# Patient Record
Sex: Female | Born: 1985 | Race: White | Hispanic: No | Marital: Single | Smoking: Never smoker
Health system: Southern US, Community
[De-identification: ages and names within clinical notes are randomized; demographics above are authoritative.]

## PROBLEM LIST (undated history)

## (undated) DIAGNOSIS — K5792 Diverticulitis of intestine, part unspecified, without perforation or abscess without bleeding: Secondary | ICD-10-CM

## (undated) DIAGNOSIS — I10 Essential (primary) hypertension: Secondary | ICD-10-CM

## (undated) HISTORY — DX: Essential (primary) hypertension: I10

## (undated) HISTORY — DX: Diverticulitis of intestine, part unspecified, without perforation or abscess without bleeding: K57.92

---
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
## 2018-11-30 ENCOUNTER — Encounter: Payer: Self-pay | Admitting: Gastroenterology

## 2019-02-04 ENCOUNTER — Other Ambulatory Visit: Payer: Self-pay

## 2019-02-04 ENCOUNTER — Telehealth: Payer: Self-pay

## 2019-02-04 ENCOUNTER — Encounter: Payer: Self-pay | Admitting: Gastroenterology

## 2019-02-04 ENCOUNTER — Encounter: Payer: Self-pay | Admitting: Nurse Practitioner

## 2019-02-04 ENCOUNTER — Ambulatory Visit (INDEPENDENT_AMBULATORY_CARE_PROVIDER_SITE_OTHER): Payer: Medicaid Other | Admitting: Nurse Practitioner

## 2019-02-04 DIAGNOSIS — K5732 Diverticulitis of large intestine without perforation or abscess without bleeding: Secondary | ICD-10-CM | POA: Diagnosis not present

## 2019-02-04 MED ORDER — CLENPIQ 10-3.5-12 MG-GM -GM/160ML PO SOLN: 1.0000 | Freq: Once | ORAL | 0 refills | Status: AC

## 2019-02-04 NOTE — Assessment & Plan Note (Signed)
The patient has had 2 episodes of diverticulitis.  Most recently she had an episode in February 2020 which required 6 days of hospitalization and drain placement due to diverticulitis complicated by an abscess.  Drain has been pulled before discharge.  Follow-up with surgeon was unremarkable.  Plans to schedule a colonoscopy however this was canceled due to the COVID-19/coronavirus pandemic.  At this point she is generally asymptomatic from a GI standpoint.  Given her history of diverticulitis with no previous colonoscopy will plan for colonoscopy at this time.  Continue current medications.  I explained the likelihood that she would be called to have preop COVID-19 testing with subsequent quarantine after the test and before her procedure.  She verbalized understanding.  I also explained the possibility if she continues to have episodes of diverticulitis there may be justification for partial colectomy.  She also verbalized understanding.  Proceed with colonoscopy with Dr. Darrick Penna in the near future. The risks, benefits, and alternatives have been discussed in detail with the patient. They state understanding and desire to proceed.   The patient is not on any anticoagulants, anxiolytics, chronic pain medications, or antidepressants.  Conscious sedation should be adequate for her procedure.

## 2019-02-04 NOTE — Patient Instructions (Signed)
Your health issues we discussed today were:   Diverticulitis: 1. As we discussed we will plan to do a colonoscopy to evaluate your colon after having diverticulitis 2. As I informed, you will likely have to have a COVID-19/coronavirus test prior to your procedure.  They will likely require you to stay quarantined after your test until your procedure.  This is for your safety as well as the safety of the staff taking care of you 3. Call us if you have any severe or worsening symptoms 4. Further recommendations will follow your colonoscopy  Overall I recommend:  1. Continue your other medications 2. Return for follow-up in 4 months 3. Call us if you have any questions or concerns.   Because of recent events of COVID-19 ("Coronavirus"), follow CDC recommendations:  1. Wash your hand frequently 2. Avoid touching your face 3. Stay away from people who are sick 4. If you have symptoms such as fever, cough, shortness of breath then call your healthcare provider for further guidance 5. If you are sick, STAY AT HOME unless otherwise directed by your healthcare provider. 6. Follow directions from state and national officials regarding staying safe   At [facility] we value your feedback. You may receive a survey about your visit today. Please share your experience as we strive to create trusting relationships with our patients to provide genuine, compassionate, quality care.  We appreciate your understanding and patience as we review any laboratory studies, imaging, and other diagnostic tests that are ordered as we care for you. Our office policy is 5 business days for review of these results, and any emergent or urgent results are addressed in a timely manner for your best interest. If you do not hear from our office in 1 week, please contact us.   We also encourage the use of MyChart, which contains your medical information for your review as well. If you are not enrolled in this  feature, an access code is on this after visit summary for your convenience. Thank you for allowing [place] to be involved in your care.  It was great to see you today!  I hope you have a great day!!

## 2019-02-04 NOTE — Telephone Encounter (Signed)
Called pt, TCS w/[facility] scheduled for 04/09/19 at 12:15pm. Rx for prep sent to pharmacy. Orders entered. Instructions mailed. Pt informed she may need to have COVID-19 test prior to procedure and be quarantined after test until procedure.

## 2019-02-04 NOTE — Progress Notes (Addendum)
REVIEWED-NO ADDITIONAL RECOMMENDATIONS.Marland Kitchen  Referring Provider: Lianne Moris, PA-C Primary Care Physician:  Lianne Moris, PA-C Primary GI:  Dr. Darrick Penna  NOTE: Service was provided via telemedicine and was requested by the patient due to COVID-19 pandemic.  Method of visit: Doxy.Me  Patient Location: Home  Provider Location: Office  Reason for Phone Visit: Referral from PCP  The patient was consented to phone follow-up via telephone encounter including billing of the encounter (yes/no): Yes  Persons present on the phone encounter, with roles: Children  Total time (minutes) spent on medical discussion: 22 minutes  Chief Complaint  Patient presents with  . Diverticulitis    diagnosed 02/2018 originally, had abcess on bladder d/t diverticulitis in feb 2020.  Marland Kitchen Abdominal Pain    comes/goes, location: bottom abd, crampy pain    HPI:   Megan West is a 33 y.o. female who presents for virtual visit regarding: On referral from primary care for diverticulitis.  Reviewed information provided with referral including office visit dated 11/18/2018.  The patient was referred for recurrent diverticulitis most recently with abscess formation.  She was recommended to increase her fluids and fiber in her diet.  Care Everywhere chart reviewed. Patient was recently admitted to [facility] hospital for diverticulitis with abscess. Had planned colonoscopy for follow-up by surgeon at [facility] which was cancelled due to COVID-19.  Today she states she's doing ok overall. Most recent diverticulitis was early February when she had abdominal pain and when she couldn't take it anymore she went to the hospital and was admitted for 6 days. She had a drain placed which was removed before discharge. Saw the surgeon 2 weeks post-discharge. They have wanted to do a colonoscopy and was schedule 01/07/19 which was cancelled and was told they would call her with a future date, but hasn't heard since. She  states she's had diverticulitis twice: once in 2019 and again in February 2020. Has never had a colonoscopy before. Denies current abdominal pain, N/V, fever, chills, unintentional weight loss; has lost 10 lbs recently with a concerted effort for weight loss. Denies URI or flu-like symptoms. Denies loss of sense of taste/smell. Denies chest pain, dyspnea, dizziness, lightheadedness, syncope, near syncope. Denies any other upper or lower GI symptoms.  Past Medical History:  Diagnosis Date  . Diverticulitis    2019 & 10/2018 (with abscess)  . Hypertension     History reviewed. No pertinent surgical history.  Current Outpatient Medications  Medication Sig Dispense Refill  . metoprolol succinate (TOPROL-XL) 50 MG 24 hr tablet Take 50 mg by mouth daily.    . ondansetron (ZOFRAN) 4 MG tablet Take 4 mg by mouth every 8 (eight) hours as needed for nausea or vomiting.     No current facility-administered medications for this visit.     Allergies as of 02/04/2019  . (No Known Allergies)    Family History  Problem Relation Age of Onset  . Colon cancer Neg Hx     Social History   Socioeconomic History  . Marital status: Single    Spouse name: Not on file  . Number of children: Not on file  . Years of education: Not on file  . Highest education level: Not on file  Occupational History  . Not on file  Social Needs  . Financial resource strain: Not on file  . Food insecurity:    Worry: Not on file    Inability: Not on file  . Transportation needs:    Medical: Not on file  Non-medical: Not on file  Tobacco Use  . Smoking status: Never Smoker  . Smokeless tobacco: Never Used  Substance and Sexual Activity  . Alcohol use: Yes    Comment: occas wine but none since 10/2018  . Drug use: Never  . Sexual activity: Not on file  Lifestyle  . Physical activity:    Days per week: Not on file    Minutes per session: Not on file  . Stress: Not on file  Relationships  . Social  connections:    Talks on phone: Not on file    Gets together: Not on file    Attends religious service: Not on file    Active member of club or organization: Not on file    Attends meetings of clubs or organizations: Not on file    Relationship status: Not on file  Other Topics Concern  . Not on file  Social History Narrative  . Not on file    Review of Systems: Complete ROS negative except as per HPI.  Physical Exam: Note: limited exam due to virtual visit General:   Alert and oriented. Pleasant and cooperative. Well-nourished and well-developed.  Head:  Normocephalic and atraumatic. Eyes:  Without icterus, sclera clear and conjunctiva pink.  Ears:  Normal auditory acuity. Skin:  Intact without facial significant lesions or rashes. Neurologic:  Alert and oriented x4;  grossly normal neurologically. Psych:  Alert and cooperative. Normal mood and affect. Heme/Lymph/Immune: No excessive bruising noted.

## 2019-02-04 NOTE — Progress Notes (Signed)
CC'D TO PCP °

## 2019-02-16 ENCOUNTER — Telehealth: Payer: Self-pay | Admitting: Gastroenterology

## 2019-02-16 DIAGNOSIS — R109 Unspecified abdominal pain: Secondary | ICD-10-CM

## 2019-02-16 NOTE — Telephone Encounter (Signed)
Needs CT abd/pelvis with contrast asap. Pregnancy screen prior.

## 2019-02-16 NOTE — Telephone Encounter (Signed)
PT said she has been having some episodes of pain in her left side ( and sometimes her right side also). More in lower abdomen. The pains are sharp and last from a few seconds to a few minutes and then she is OK. They are happening more frequently and she is sure that it is a flare of diverticulitis. She has had no fever and no nausea or vomiting. Also, since her last flare when she had an abcess on her bladder, she started having some dizzy spells and this happens occasionally also. She has never passed out, but it is just worrisome. She is aware Wynne Dust, NP who she had visit with is out this week. I am sending message to Lewie Loron, NP to advise.

## 2019-02-16 NOTE — Telephone Encounter (Signed)
PA for CT a/p with contrast was approved via [employer]'s website. Auth# [id number] dates 02/16/2019-08/15/2019.  CT scheduled for 5/28 at 3pm, arrival time 2:45pm, npo 4 hrs prior, needs to pick up oral contrast from [facility] radiology no later than tomorrow, will need to go for lab work today/tomorrow as well.  Called patient, VM full.

## 2019-02-16 NOTE — Telephone Encounter (Signed)
Pt has questions about her diverticulitis and could we call something in at [facility]. [phone]

## 2019-02-16 NOTE — Telephone Encounter (Signed)
Patient is aware of appt details. She voiced understanding.

## 2019-02-16 NOTE — Addendum Note (Signed)
Addended by: Tommie Sams on: 02/16/2019 03:30 PM   Modules accepted: Orders

## 2019-02-16 NOTE — Telephone Encounter (Signed)
Pt is aware and forwarding to [facility] to schedule.  

## 2019-02-17 ENCOUNTER — Other Ambulatory Visit (HOSPITAL_COMMUNITY)
Admission: RE | Admit: 2019-02-17 | Discharge: 2019-02-17 | Disposition: A | Discharge status: Home / Self Care | Payer: Medicaid Other | Source: Ambulatory Visit | Attending: Gastroenterology | Admitting: Gastroenterology

## 2019-02-17 ENCOUNTER — Other Ambulatory Visit: Payer: Self-pay

## 2019-02-17 DIAGNOSIS — R109 Unspecified abdominal pain: Secondary | ICD-10-CM | POA: Insufficient documentation

## 2019-02-17 LAB — HCG, SERUM, QUALITATIVE: Preg, Serum: NEGATIVE

## 2019-02-18 ENCOUNTER — Other Ambulatory Visit: Payer: Self-pay

## 2019-02-18 ENCOUNTER — Ambulatory Visit (HOSPITAL_COMMUNITY)
Admission: RE | Admit: 2019-02-18 | Discharge: 2019-02-18 | Disposition: A | Discharge status: Home / Self Care | Payer: Medicaid Other | Source: Ambulatory Visit | Attending: Gastroenterology | Admitting: Gastroenterology

## 2019-02-18 ENCOUNTER — Other Ambulatory Visit: Payer: Self-pay | Admitting: Gastroenterology

## 2019-02-18 DIAGNOSIS — R109 Unspecified abdominal pain: Secondary | ICD-10-CM | POA: Insufficient documentation

## 2019-02-18 LAB — POCT I-STAT CREATININE: Creatinine, Ser: 0.6 mg/dL (ref 0.44–1.00)

## 2019-02-18 MED ORDER — IOHEXOL 300 MG/ML  SOLN
100.0000 mL | Freq: Once | INTRAMUSCULAR | Status: AC | PRN
  Administered 2019-02-18: 100 mL via INTRAVENOUS

## 2019-02-18 MED ORDER — METRONIDAZOLE 500 MG PO TABS: 500.0000 mg | ORAL_TABLET | Freq: Three times a day (TID) | ORAL | 0 refills | Status: AC

## 2019-02-18 MED ORDER — CIPROFLOXACIN HCL 500 MG PO TABS: 500.0000 mg | ORAL_TABLET | Freq: Two times a day (BID) | ORAL | 0 refills | Status: AC

## 2019-02-18 NOTE — Progress Notes (Signed)
Discussed with Dr. Allena Katz, radiologist. Definite abscess. No definite fistulous tract to bladder but will need to monitor clinically. I was able to reach patient. She is afebrile, mild, fleeting pain intermittently. No N/V. No changes in bowel habits. Discussed with Dr. Darrick Penna. Will treat for 2 weeks with antibiotics as outpatient and need to repeat CT in 3 weeks.   [facility] clinical pool: please arrange CT abd/pelvis in 3 weeks, follow-up diverticulitis with abscess. Also, we need to go ahead and refer to surgery for discussion of colectomy. Colonoscopy is scheduled for July: keep date for now but may change depending on clinical progression.  Patient aware.

## 2019-02-22 ENCOUNTER — Encounter: Payer: Self-pay | Admitting: *Deleted

## 2019-02-22 ENCOUNTER — Telehealth: Payer: Self-pay | Admitting: Nurse Practitioner

## 2019-02-22 ENCOUNTER — Other Ambulatory Visit: Payer: Self-pay | Admitting: *Deleted

## 2019-02-22 DIAGNOSIS — K5732 Diverticulitis of large intestine without perforation or abscess without bleeding: Secondary | ICD-10-CM

## 2019-02-22 DIAGNOSIS — K5792 Diverticulitis of intestine, part unspecified, without perforation or abscess without bleeding: Secondary | ICD-10-CM

## 2019-02-22 NOTE — Telephone Encounter (Signed)
Attempted to call patient and ask which surgeon she would like to see for recurrent diverticulitis (most recent with abscess). Received "call cannot be completed as dialed" despite multiple attempts in different ways. Will try again later.

## 2019-02-22 NOTE — Telephone Encounter (Signed)
Spoke with patient and she lives in [place] but prefers [facility]. Aware of recommendation for surgical consult to consider surgical treatment of recurrent diverticulitis (most recently with abscess requiring drain placement.)  Please refer to [facility] (Drs. Shon Baton and Lovell Sheehan)  Cc: [facility] for [facility]

## 2019-02-22 NOTE — Addendum Note (Signed)
Addended by: Tommie Sams on: 02/22/2019 01:35 PM   Modules accepted: Orders

## 2019-02-22 NOTE — Telephone Encounter (Signed)
REVIEWED-NO ADDITIONAL RECOMMENDATIONS. 

## 2019-02-22 NOTE — Telephone Encounter (Signed)
Referral placed.

## 2019-02-25 ENCOUNTER — Telehealth: Payer: Self-pay | Admitting: *Deleted

## 2019-02-25 NOTE — Telephone Encounter (Signed)
Per AB we need to postpone TCS, patient needs to see surgery first.  I attempted to call patient but VM and unable to leave VM. Also looks like the surgeon's office have been unable to reach patient as well.  Tobi Bastos, are we canceling for now?

## 2019-03-01 ENCOUNTER — Telehealth: Payer: Self-pay | Admitting: *Deleted

## 2019-03-01 NOTE — Telephone Encounter (Signed)
PA initiated vis [employer] website and it was approved for CT abd/pelvis with contrast. Authorization Number:[id number] dates 03/01/2019-08/28/2019.

## 2019-03-01 NOTE — Telephone Encounter (Signed)
Hold off on TCS right now. Needs surgical referral.

## 2019-03-01 NOTE — Telephone Encounter (Signed)
Called # for pt and received message the # has been changed, D/C or no longer in service. I have mailed patient letter. No alternate # in chart. FYI to AB we have not been able to each patient and surgery has also sent letter.

## 2019-03-09 NOTE — Telephone Encounter (Signed)
Noted. She will still need CT when recovers. Please have her contact us in 2 weeks. We may need to send letter as it has been difficult to touch base with her.

## 2019-03-09 NOTE — Telephone Encounter (Signed)
Patient CT was cancelled. Reason states patient positive for COVID-19. FYI to AB

## 2019-03-09 NOTE — Telephone Encounter (Signed)
Letter mailed

## 2019-03-16 ENCOUNTER — Ambulatory Visit: Payer: Self-pay | Admitting: General Surgery

## 2019-03-16 ENCOUNTER — Ambulatory Visit (HOSPITAL_COMMUNITY): Payer: Medicaid Other

## 2019-04-09 ENCOUNTER — Ambulatory Visit (HOSPITAL_COMMUNITY): Admit: 2019-04-09 | Payer: Medicaid Other | Admitting: Gastroenterology

## 2019-04-09 ENCOUNTER — Encounter (HOSPITAL_COMMUNITY): Payer: Self-pay

## 2019-04-09 SURGERY — COLONOSCOPY
Anesthesia: Moderate Sedation

## 2019-04-14 ENCOUNTER — Other Ambulatory Visit (HOSPITAL_COMMUNITY): Payer: Medicaid Other

## 2019-04-15 ENCOUNTER — Other Ambulatory Visit: Payer: Self-pay

## 2019-04-15 ENCOUNTER — Ambulatory Visit (HOSPITAL_COMMUNITY)
Admission: RE | Admit: 2019-04-15 | Discharge: 2019-04-15 | Disposition: A | Discharge status: Home / Self Care | Payer: Medicaid Other | Source: Ambulatory Visit | Attending: Gastroenterology | Admitting: Gastroenterology

## 2019-04-15 DIAGNOSIS — K5732 Diverticulitis of large intestine without perforation or abscess without bleeding: Secondary | ICD-10-CM | POA: Insufficient documentation

## 2019-04-15 LAB — POCT I-STAT CREATININE: Creatinine, Ser: 0.6 mg/dL (ref 0.44–1.00)

## 2019-04-15 MED ORDER — IOHEXOL 350 MG/ML SOLN: 100.0000 mL | Freq: Once | INTRAVENOUS | Status: DC | PRN

## 2019-04-15 MED ORDER — IOHEXOL 300 MG/ML  SOLN
100.0000 mL | Freq: Once | INTRAMUSCULAR | Status: AC | PRN
  Administered 2019-04-15: 100 mL via INTRAVENOUS

## 2019-04-20 ENCOUNTER — Ambulatory Visit (INDEPENDENT_AMBULATORY_CARE_PROVIDER_SITE_OTHER): Payer: Medicaid Other | Admitting: General Surgery

## 2019-04-20 ENCOUNTER — Other Ambulatory Visit: Payer: Self-pay

## 2019-04-20 ENCOUNTER — Encounter: Payer: Self-pay | Admitting: General Surgery

## 2019-04-20 VITALS — BP 132/89 | HR 92 | Temp 97.7°F | Resp 16 | Ht 60.0 in | Wt 164.0 lb

## 2019-04-20 DIAGNOSIS — K5732 Diverticulitis of large intestine without perforation or abscess without bleeding: Secondary | ICD-10-CM

## 2019-04-20 MED ORDER — METRONIDAZOLE 500 MG PO TABS: 1000.0000 mg | ORAL_TABLET | ORAL | 0 refills | Status: DC

## 2019-04-20 MED ORDER — NEOMYCIN SULFATE 500 MG PO TABS: 1000.0000 mg | ORAL_TABLET | ORAL | 0 refills | Status: DC

## 2019-04-20 NOTE — Progress Notes (Addendum)
[facility] History and Physical  Reason for Referral: Diverticulitis  Referring Physician:  Wynne DustEric Gill, NP, Dr. Darrick PennaFields, MD   Chief Complaint    Pre-op Exam      Megan RootsJessica L West is a 33 y.o. female.  HPI: Megan West is a 33 yo with a history of recurrent diverticulitis of her sigmoid colon. She has had 3 episodes of diverticulitis that she knows about, 2 of which required admission. Her episode in February resulted in her being in the hospital for 1 week, and she developed an abscess that was drained.  [facility], and had her drain subsequently removed.  Per report from GI she [facility] but this was canceled due to COVID.  She has been evaluated by [facility], Wynne DustEric Gill, NP over telemedicine due to COVID.  She was scheduled to have a colonoscopy with [facility] but this was canceled due to worsening symptoms, and she underwent a repeat CT that demonstrated continued fluid collection concerning for an abscess, and she was treated again with antibiotics. She then had COVID in June 2020 per her report, and comes in today to discuss her options for diverticulitis.    She says she has chronic left sided pain. Her repeat scan 03/2019 shows a continued collection and concern for developing fistula. She denies any pneumaturia or sediment in her urine. She has multiple Bms daily and finished her last round of antibiotics 2 weeks ago per report.   Past Medical History:  Diagnosis Date  . Diverticulitis    2019 & 10/2018 (with abscess)  . Hypertension     History reviewed. No pertinent surgical history.  No prior surgeries.   Family History  Problem Relation Age of Onset  . Colon cancer Neg Hx     Social History   Tobacco Use  . Smoking status: Never Smoker  . Smokeless tobacco: Never Used  Substance Use Topics  . Alcohol use: Yes    Comment: occas wine but none since 10/2018  . Drug use: Never    Medications: I have  reviewed the patient's current medications. Allergies as of 04/20/2019   No Known Allergies     Medication List       Accurate as of April 20, 2019 11:59 PM. If you have any questions, ask your nurse or doctor.        medroxyPROGESTERone 150 MG/ML injection Commonly known as: DEPO-PROVERA INJECT ONE ML INTRAMUSCULARLY EVERY THREE MONTHS   metoprolol succinate 50 MG 24 hr tablet Commonly known as: TOPROL-XL Take 50 mg by mouth daily.   metroNIDAZOLE 500 MG tablet Commonly known as: Flagyl Take 2 tablets (1,000 mg total) by mouth as directed. Take 2 metronidazole 500mg  tablets at 2 pm, 3pm and 10 pm the day prior to surgery. Started by: Lucretia RoersLindsay C Emmaleigh Longo, MD   neomycin 500 MG tablet Commonly known as: MYCIFRADIN Take 2 tablets (1,000 mg total) by mouth as directed. Take 2 neomyocin 500mg  tablets at 2 pm, 3pm and 10 pm the day prior to surgery. Started by: Lucretia RoersLindsay C Jamisen Hawes, MD   ondansetron 4 MG tablet Commonly known as: ZOFRAN Take 4 mg by mouth every 8 (eight) hours as needed for nausea or vomiting.        ROS:  A comprehensive review of systems was negative except for: Gastrointestinal: positive for abdominal pain left sided abdominal pain  Blood pressure 132/89, pulse 92, temperature 97.7 F (36.5 C), temperature source  Tympanic, resp. rate 16, height 5' (1.524 m), weight 164 lb (74.4 kg), SpO2 98 %. Physical Exam Vitals signs reviewed.  Constitutional:      Appearance: Normal appearance.  HENT:     Head: Normocephalic and atraumatic.     Nose: Nose normal.     Mouth/Throat:     Mouth: Mucous membranes are moist.  Eyes:     Extraocular Movements: Extraocular movements intact.     Pupils: Pupils are equal, round, and reactive to light.  Neck:     Musculoskeletal: Normal range of motion. No neck rigidity.  Cardiovascular:     Rate and Rhythm: Normal rate and regular rhythm.  Pulmonary:     Effort: Pulmonary effort is normal.     Breath sounds: Normal breath  sounds.  Abdominal:     General: There is no distension.     Palpations: Abdomen is soft.     Tenderness: There is abdominal tenderness.     Comments: Pain on deep palpation, belly button ring  Musculoskeletal: Normal range of motion.        General: No swelling.  Skin:    General: Skin is warm and dry.  Neurological:     General: No focal deficit present.     Mental Status: She is alert and oriented to person, place, and time.  Psychiatric:        Mood and Affect: Mood normal.        Behavior: Behavior normal.        Thought Content: Thought content normal.        Judgment: Judgment normal.     Results: CT 03/2019- personally reviewed; no thickening of inflammation around colon, diverticula noted throughout colon, extensive in sigmoid, small area 2cm between sigmoid and bladder  Assessment & Plan:  Megan West is a 33 y.o. female with multiple episodes of diverticulitis who comes into discuss her options. She has not had a colonoscopy and has no reported bloody Bms or family history of colon cancer. She has had multiple CTs and this shows a continued fluid collection that is probably a phlegmon from her prior abscess given the lack of fever, chills, etc.   Overall, given her multiple episodes and history of a complicated episode requiring a drain, I would recommend surgery to remove that part of the colon. We discussed that this decision is made on an individual basis and every person and diverticulitis episode is different. We discussed the risk of surgery including but not limited to bleeding, infection, risk of anastomotic leak, risk of needing a colostomy, risk of needing another surgery, risk of injury to the ureter, risk of this being something like a cancer, discussed the possible need for an open surgery.   We discussed colonoscopy and timing for colonoscopy.  I have reached out to Dr. Oneida Alar and Walden Field, NP. At this time given that there is low risk for any other pathology,  continued fluid collection, we are going to proceed with surgery and post operative colonoscopy.    Plan for Laparoscopic partial colectomy in the upcoming weeks  Bowel preparation given to the patient Discussed hospital stay/ post op course of 5-7 days  Discussed need for no heavy lifting > 10 lbs for 6-8 weeks post op  Buy from the Store: Miralax bottle (288g).  Gatorade 64 oz (not red). Dulcolax tablets.   The Day Prior to Surgery: Take 4 ducolax tablets at 7am with water. Drink plenty of clear liquids all day to  avoid dehydration, no solid food.    Mix the bottle of Miralax and 64 oz of Gatorade and drink this mixture starting at 10am. Drink it gradually over the next few hours, 8 ounces every 15-30 minutes until it is gone. Finish this by 2pm.  Take 2 neomycin 500mg  tablets and 2 metronidazole 500mg  tablets at 2 pm. Take 2 neomycin 500mg  tablets and 2 metronidazole 500mg  tablets at 3pm. Take 2 neomycin 500mg  tablets and 2 metronidazole 500mg  tablets at 10pm.    Do not eat or drink anything after midnight the night before your surgery.  Do not eat or drink anything that morning, and take medications as instructed by the hospital staff on your preoperative visit.   Discussed COVID 19 and need for preoperative COVID testing. She has already been positive in June. Discussed need for self isolation after testing. Will confirm with preop repeat testing on this patient. COVID test was not in our system for her June infection.   All questions were answered to the satisfaction of the patient and family.  Will get her scheduled as soon as possible. Rx sent to her pharmacy.   Lucretia RoersLindsay C Kodee Drury 04/23/2019, 3:09 PM

## 2019-04-20 NOTE — Patient Instructions (Addendum)
Buy from the Store: Miralax bottle (288g).  Gatorade 64 oz (not red). Dulcolax tablets.   The Day Prior to Surgery: Take 4 ducolax tablets at 7am with water. Drink plenty of clear liquids all day to avoid dehydration, no solid food.    Mix the bottle of Miralax and 64 oz of Gatorade and drink this mixture starting at 10am. Drink it gradually over the next few hours, 8 ounces every 15-30 minutes until it is gone. Finish this by 2pm.  Take 2 neomycin 500mg tablets and 2 metronidazole 500mg tablets at 2 pm. Take 2 neomycin 500mg tablets and 2 metronidazole 500mg tablets at 3pm. Take 2 neomycin 500mg tablets and 2 metronidazole 500mg tablets at 10pm.    Do not eat or drink anything after midnight the night before your surgery.  Do not eat or drink anything that morning, and take medications as instructed by the hospital staff on your preoperative visit.    Laparoscopic Partial Colectomy Laparoscopic colectomy is surgery to remove part or all of the large intestine (colon). This procedure may be used to treat several conditions, including:  Inflammation and infection of the colon (diverticulitis).  Tumors or masses in the colon.  Inflammatory bowel disease, such as Crohn disease or ulcerative colitis. Colectomy is an option when symptoms cannot be controlled with medicines.  Bleeding from the colon that cannot be controlled by another method.  Blockage or obstruction of the colon. Tell a health care provider about:  Any allergies you have.  All medicines you are taking, including vitamins, herbs, eye drops, creams, and over-the-counter medicines.  Any problems you or family members have had with anesthetic medicines.  Any blood disorders you have.  Any surgeries you have had.  Any medical conditions you have. What are the risks? Generally, this is a safe procedure. However, problems may occur, including:  Infection.  Bleeding.  Allergic reactions to medicines or dyes.   Damage to other structures or organs.  Leaking from where the colon was sewn together.  Future blockage of the small intestines from scar tissue. Another surgery may be needed to repair this.  Needing to convert to an open procedure. Complications such as damage to other organs or excessive bleeding may require the surgeon to convert from a laparoscopic procedure to an open procedure. This involves making a larger incision in the abdomen. Medicines  Ask your health care provider about: ? Changing or stopping your regular medicines. This is especially important if you are taking diabetes medicines or blood thinners. ? Taking medicines such as aspirin and ibuprofen. These medicines can thin your blood. Do not take these medicines before your procedure if your health care provider instructs you not to.  You may be given antibiotic medicine to clean out bacteria from your colon. Follow the directions carefully and take the medicine at the correct time. General instructions  You may be prescribed an oral bowel prep to clean out your colon in preparation for the surgery: ? Follow instructions from your health care provider about how to do this. ? Do not eat or drink anything else after you have started the bowel prep, unless your health care provider tells you it is safe to do so.  Do not use any products that contain nicotine or tobacco, such as cigarettes and e-cigarettes. If you need help quitting, ask your health care provider. What happens during the procedure?  To reduce your risk of infection: ? Your health care team will wash or sanitize their hands. ?   Your skin will be washed with soap.  An IV tube will be inserted into one of your veins to deliver fluid and medication.  You will be given one of the following: ? A medicine to help you relax (sedative). ? A medicine to make you fall asleep (general anesthetic).  Small monitors will be connected to your body. They will be used to  check your heart, blood pressure, and oxygen level.  A breathing tube may be placed into your lungs during the procedure.  A thin, flexible tube (catheter) will be placed into your bladder to drain urine.  A tube may be placed through your nose and into your stomach to drain stomach fluids (nasogastric tube, or NG tube).  Your abdomen will be filled with air so it expands. This gives the surgeon more room to operate and makes your organs easier to see.  Several small cuts (incisions) will be made in your abdomen.  A thin, lighted tube with a tiny camera on the end (laparoscope) will be put through one of the small incisions. The camera on the laparoscope will send a picture to a computer screen in the operating room. This will give the surgeon a good view inside your abdomen.  Hollow tubes will be put through the other small incisions in your abdomen. The tools that are needed for the procedure will be put through these tubes.  Clamps or staples will be put on both ends of the diseased part of the colon.  The part of the intestine between the clamps or staples will be removed.  If possible, the ends of the healthy colon that remain will be stitched (sutured) or stapled together to allow your body to pass waste (stool).  Sometimes, the remaining colon cannot be stitched back together. If this is the case, a colostomy will be needed. If you need a colostomy: ? An opening to the outside of your body (stoma) will be made through your abdomen. ? The end of your colon will be brought to the opening. It will be stitched to the skin. ? A bag will be attached to the opening. Stool will drain into this removable bag. ? The colostomy may be temporary or permanent.  The incisions from the colectomy will be closed with sutures or staples. The procedure may vary among health care providers and hospitals. What happens after the procedure?  Your blood pressure, heart rate, breathing rate, and blood  oxygen level will be monitored until the medicines you were given have worn off.  You will receive fluids through an IV tube until your bowels start to work properly.  Once your bowels are working again, you will be given clear liquids first and then solid food as tolerated.  You will be given medicines to control your pain and nausea, if needed.  Do not drive for 24 hours if you were given a sedative. This information is not intended to replace advice given to you by your health care provider. Make sure you discuss any questions you have with your health care provider. Document Released: 11/30/2002 Document Revised: 08/22/2017 Document Reviewed: 06/10/2016 Elsevier Patient Education  2020 [employer]  

## 2019-04-20 NOTE — Progress Notes (Signed)
Patient is seeing Dr. Constance Haw today. FYI to [facility] and for further management. I have never personally seen patient and ordered this as per plan while original provider was out.

## 2019-04-23 ENCOUNTER — Encounter: Payer: Self-pay | Admitting: General Surgery

## 2019-04-26 NOTE — H&P (Signed)
[facility] History and Physical  Reason for Referral: Diverticulitis  Referring Physician:  Eric Gill, NP, Dr. Fields, MD   Chief Complaint    Pre-op Exam      Megan West is a 33 y.o. female.  HPI: Megan West is a 33 yo with a history of recurrent diverticulitis of her sigmoid colon. She has had 3 episodes of diverticulitis that she knows about, 2 of which required admission. Her episode in February resulted in her being in the hospital for 1 week, and she developed an abscess that was drained.  She had this all done at [facility], and had her drain subsequently removed.  Per report from GI she has a colonoscopy scheduled at [facility] but this was canceled due to COVID.  She has been evaluated by [facility], Eric Gill, NP over telemedicine due to COVID.  She was scheduled to have a colonoscopy with [facility] but this was canceled due to worsening symptoms, and she underwent a repeat CT that demonstrated continued fluid collection concerning for an abscess, and she was treated again with antibiotics. She then had COVID in June 2020 per her report, and comes in today to discuss her options for diverticulitis.    She says she has chronic left sided pain. Her repeat scan 03/2019 shows a continued collection and concern for developing fistula. She denies any pneumaturia or sediment in her urine. She has multiple Bms daily and finished her last round of antibiotics 2 weeks ago per report.   Past Medical History:  Diagnosis Date  . Diverticulitis    2019 & 10/2018 (with abscess)  . Hypertension     History reviewed. No pertinent surgical history.  No prior surgeries.   Family History  Problem Relation Age of Onset  . Colon cancer Neg Hx     Social History   Tobacco Use  . Smoking status: Never Smoker  . Smokeless tobacco: Never Used  Substance Use Topics  . Alcohol use: Yes    Comment: occas wine but none since 10/2018  . Drug use: Never    Medications: I have  reviewed the patient's current medications. Allergies as of 04/20/2019   No Known Allergies     Medication List       Accurate as of April 20, 2019 11:59 PM. If you have any questions, ask your nurse or doctor.        medroxyPROGESTERone 150 MG/ML injection Commonly known as: DEPO-PROVERA INJECT ONE ML INTRAMUSCULARLY EVERY THREE MONTHS   metoprolol succinate 50 MG 24 hr tablet Commonly known as: TOPROL-XL Take 50 mg by mouth daily.   metroNIDAZOLE 500 MG tablet Commonly known as: Flagyl Take 2 tablets (1,000 mg total) by mouth as directed. Take 2 metronidazole 500mg tablets at 2 pm, 3pm and 10 pm the day prior to surgery. Started by:  C , MD   neomycin 500 MG tablet Commonly known as: MYCIFRADIN Take 2 tablets (1,000 mg total) by mouth as directed. Take 2 neomyocin 500mg tablets at 2 pm, 3pm and 10 pm the day prior to surgery. Started by:  C , MD   ondansetron 4 MG tablet Commonly known as: ZOFRAN Take 4 mg by mouth every 8 (eight) hours as needed for nausea or vomiting.        ROS:  A comprehensive review of systems was negative except for: Gastrointestinal: positive for abdominal pain left sided abdominal pain  Blood pressure 132/89, pulse 92, temperature 97.7 F (36.5 C), temperature source   Tympanic, resp. rate 16, height 5' (1.524 m), weight 164 lb (74.4 kg), SpO2 98 %. Physical Exam Vitals signs reviewed.  Constitutional:      Appearance: Normal appearance.  HENT:     Head: Normocephalic and atraumatic.     Nose: Nose normal.     Mouth/Throat:     Mouth: Mucous membranes are moist.  Eyes:     Extraocular Movements: Extraocular movements intact.     Pupils: Pupils are equal, round, and reactive to light.  Neck:     Musculoskeletal: Normal range of motion. No neck rigidity.  Cardiovascular:     Rate and Rhythm: Normal rate and regular rhythm.  Pulmonary:     Effort: Pulmonary effort is normal.     Breath sounds: Normal breath  sounds.  Abdominal:     General: There is no distension.     Palpations: Abdomen is soft.     Tenderness: There is abdominal tenderness.     Comments: Pain on deep palpation, belly button ring  Musculoskeletal: Normal range of motion.        General: No swelling.  Skin:    General: Skin is warm and dry.  Neurological:     General: No focal deficit present.     Mental Status: She is alert and oriented to person, place, and time.  Psychiatric:        Mood and Affect: Mood normal.        Behavior: Behavior normal.        Thought Content: Thought content normal.        Judgment: Judgment normal.     Results: CT 03/2019- personally reviewed; no thickening of inflammation around colon, diverticula noted throughout colon, extensive in sigmoid, small area 2cm between sigmoid and bladder  Assessment & Plan:  Megan West is a 33 y.o. female with multiple episodes of diverticulitis who comes into discuss her options. She has not had a colonoscopy and has no reported bloody Bms or family history of colon cancer. She has had multiple CTs and this shows a continued fluid collection that is probably a phlegmon from her prior abscess given the lack of fever, chills, etc.   Overall, given her multiple episodes and history of a complicated episode requiring a drain, I would recommend surgery to remove that part of the colon. We discussed that this decision is made on an individual basis and every person and diverticulitis episode is different. We discussed the risk of surgery including but not limited to bleeding, infection, risk of anastomotic leak, risk of needing a colostomy, risk of needing another surgery, risk of injury to the ureter, risk of this being something like a cancer, discussed the possible need for an open surgery.   We discussed colonoscopy and timing for colonoscopy.  I have reached out to Dr. Fields and Eric Gill, NP. At this time given that there is low risk for any other pathology,  continued fluid collection, we are going to proceed with surgery and post operative colonoscopy.    Plan for Laparoscopic partial colectomy in the upcoming weeks  Bowel preparation given to the patient Discussed hospital stay/ post op course of 5-7 days  Discussed need for no heavy lifting > 10 lbs for 6-8 weeks post op  Buy from the Store: Miralax bottle (288g).  Gatorade 64 oz (not red). Dulcolax tablets.   The Day Prior to Surgery: Take 4 ducolax tablets at 7am with water. Drink plenty of clear liquids all day to   avoid dehydration, no solid food.    Mix the bottle of Miralax and 64 oz of Gatorade and drink this mixture starting at 10am. Drink it gradually over the next few hours, 8 ounces every 15-30 minutes until it is gone. Finish this by 2pm.  Take 2 neomycin 500mg tablets and 2 metronidazole 500mg tablets at 2 pm. Take 2 neomycin 500mg tablets and 2 metronidazole 500mg tablets at 3pm. Take 2 neomycin 500mg tablets and 2 metronidazole 500mg tablets at 10pm.    Do not eat or drink anything after midnight the night before your surgery.  Do not eat or drink anything that morning, and take medications as instructed by the hospital staff on your preoperative visit.   Discussed COVID 19 and need for preoperative COVID testing. She has already been positive in June. Discussed need for self isolation after testing. Will confirm with preop repeat testing on this patient. COVID test was not in our system for her June infection.   All questions were answered to the satisfaction of the patient and family.  Will get her scheduled as soon as possible. Rx sent to her pharmacy.    C  04/23/2019, 3:09 PM       

## 2019-04-26 NOTE — Patient Instructions (Signed)
Megan West  04/26/2019     @PREFPERIOPPHARMACY @   Your procedure is scheduled on  05/03/2019.  Report to [facility] at  900   A.M.  Call this number if you have problems the morning of surgery:  [phone]   Remember:  Follow The diet and prep instructions given to you by Dr Henreitta Leber.                 Take these medicines the morning of surgery with A SIP OF WATER Metoprolol, zofran(if needed).    Do not wear jewelry, make-up or nail polish.  Do not wear lotions, powders, or perfumes, or deodorant.  Do not shave 48 hours prior to surgery.  Men may shave face and neck.  Do not bring valuables to the hospital.  [facility] is not responsible for any belongings or valuables.  Contacts, dentures or bridgework may not be worn into surgery.  Leave your suitcase in the car.  After surgery it may be brought to your room.  For patients admitted to the hospital, discharge time will be determined by your treatment team.  Patients discharged the day of surgery will not be allowed to drive home.   Name and phone number of your driver:   fammily Special instructions:  None  Please read over the following fact sheets that you were given. Anesthesia Post-op Instructions and Care and Recovery After Surgery       Laparoscopic Colectomy, Care After This sheet gives you information about how to care for yourself after your procedure. Your health care provider may also give you more specific instructions. If you have problems or questions, contact your health care provider. What can I expect after the procedure? After your procedure, it is common to have the following:  Pain in your abdomen, especially in the incision areas. You will be given medicine to control the pain.  Tiredness. This is a normal part of the recovery process. Your energy level will return to normal over the next several weeks.  Changes in your bowel movements, such as constipation or needing to go more  often. Talk with your health care provider about how to manage this. Follow these instructions at home: Medicines  Take over-the-counter and prescription medicines only as told by your health care provider.  Do not drive or use heavy machinery while taking prescription pain medicine.  Do not drink alcohol while taking prescription pain medicine.  If you were prescribed an antibiotic medicine, use it as told by your health care provider. Do not stop using the antibiotic even if you start to feel better. Incision care   Follow instructions from your health care provider about how to take care of your incision areas. Make sure you: ? Keep your incisions clean and dry. ? Wash your hands with soap and water before and after applying medicine to the areas, and before and after changing your bandage (dressing). If soap and water are not available, use hand sanitizer. ? Change your dressing as told by your health care provider. ? Leave stitches (sutures), skin glue, or adhesive strips in place. These skin closures may need to stay in place for 2 weeks or longer. If adhesive strip edges start to loosen and curl up, you may trim the loose edges. Do not remove adhesive strips completely unless your health care provider tells you to do that.  Do not wear tight clothing over the incisions. Tight clothing may rub  and irritate the incision areas, which may cause the incisions to open.  Do not take baths, swim, or use a hot tub until your health care provider approves. Ask your health care provider if you can take showers. You may only be allowed to take sponge baths for bathing.  Check your incision area every day for signs of infection. Check for: ? More redness, swelling, or pain. ? More fluid or blood. ? Warmth. ? Pus or a bad smell. Activity  Avoid lifting anything that is heavier than 10 lb (4.5 kg) for 2 weeks or until your health care provider says it is okay.  You may resume normal  activities as told by your health care provider. Ask your health care provider what activities are safe for you.  Take rest breaks during the day as needed. Eating and drinking  Follow instructions from your health care provider about what you can eat after surgery.  To prevent or treat constipation while you are taking prescription pain medicine, your health care provider may recommend that you: ? Drink enough fluid to keep your urine clear or pale yellow. ? Take over-the-counter or prescription medicines. ? Eat foods that are high in fiber, such as fresh fruits and vegetables, whole grains, and beans. ? Limit foods that are high in fat and processed sugars, such as fried and sweet foods. General instructions  Ask your health care provider when you will need an appointment to get your sutures or staples removed.  Keep all follow-up visits as told by your health care provider. This is important. Contact a health care provider if:  You have more redness, swelling, or pain around your incisions.  You have more fluid or blood coming from the incisions.  Your incisions feel warm to the touch.  You have pus or a bad smell coming from your incisions or your dressing.  You have a fever.  You have an incision that breaks open (edges not staying together) after sutures or staples have been removed. Get help right away if:  You develop a rash.  You have chest pain or difficulty breathing.  You have pain or swelling in your legs.  You feel light-headed or you faint.  Your abdomen swells (becomes distended).  You have nausea or vomiting.  You have blood in your stool (feces). This information is not intended to replace advice given to you by your health care provider. Make sure you discuss any questions you have with your health care provider. Document Released: 03/29/2005 Document Revised: 05/29/2018 Document Reviewed: 06/10/2016 Elsevier Patient Education  2020 [employer]  General Anesthesia, Adult, Care After This sheet gives you information about how to care for yourself after your procedure. Your health care provider may also give you more specific instructions. If you have problems or questions, contact your health care provider. What can I expect after the procedure? After the procedure, the following side effects are common:  Pain or discomfort at the IV site.  Nausea.  Vomiting.  Sore throat.  Trouble concentrating.  Feeling cold or chills.  Weak or tired.  Sleepiness and fatigue.  Soreness and body aches. These side effects can affect parts of the body that were not involved in surgery. Follow these instructions at home:  For at least 24 hours after the procedure:  Have a responsible adult stay with you. It is important to have someone help care for you until you are awake and alert.  Rest as needed.  Do not: ?  Participate in activities in which you could fall or become injured. ? Drive. ? Use heavy machinery. ? Drink alcohol. ? Take sleeping pills or medicines that cause drowsiness. ? Make important decisions or sign legal documents. ? Take care of children on your own. Eating and drinking  Follow any instructions from your health care provider about eating or drinking restrictions.  When you feel hungry, start by eating small amounts of foods that are soft and easy to digest (bland), such as toast. Gradually return to your regular diet.  Drink enough fluid to keep your urine pale yellow.  If you vomit, rehydrate by drinking water, juice, or clear broth. General instructions  If you have sleep apnea, surgery and certain medicines can increase your risk for breathing problems. Follow instructions from your health care provider about wearing your sleep device: ? Anytime you are sleeping, including during daytime naps. ? While taking prescription pain medicines, sleeping medicines, or medicines that make you  drowsy.  Return to your normal activities as told by your health care provider. Ask your health care provider what activities are safe for you.  Take over-the-counter and prescription medicines only as told by your health care provider.  If you smoke, do not smoke without supervision.  Keep all follow-up visits as told by your health care provider. This is important. Contact a health care provider if:  You have nausea or vomiting that does not get better with medicine.  You cannot eat or drink without vomiting.  You have pain that does not get better with medicine.  You are unable to pass urine.  You develop a skin rash.  You have a fever.  You have redness around your IV site that gets worse. Get help right away if:  You have difficulty breathing.  You have chest pain.  You have blood in your urine or stool, or you vomit blood. Summary  After the procedure, it is common to have a sore throat or nausea. It is also common to feel tired.  Have a responsible adult stay with you for the first 24 hours after general anesthesia. It is important to have someone help care for you until you are awake and alert.  When you feel hungry, start by eating small amounts of foods that are soft and easy to digest (bland), such as toast. Gradually return to your regular diet.  Drink enough fluid to keep your urine pale yellow.  Return to your normal activities as told by your health care provider. Ask your health care provider what activities are safe for you. This information is not intended to replace advice given to you by your health care provider. Make sure you discuss any questions you have with your health care provider. Document Released: 12/16/2000 Document Revised: 09/12/2017 Document Reviewed: 04/25/2017 Elsevier Patient Education  2020 [employer]. How to Use Chlorhexidine for Bathing Chlorhexidine gluconate (CHG) is a germ-killing (antiseptic) solution that is used to clean  the skin. It can get rid of the bacteria that normally live on the skin and can keep them away for about 24 hours. To clean your skin with CHG, you may be given:  A CHG solution to use in the shower or as part of a sponge bath.  A prepackaged cloth that contains CHG. Cleaning your skin with CHG may help lower the risk for infection:  While you are staying in the intensive care unit of the hospital.  If you have a vascular access, such as a central  line, to provide short-term or long-term access to your veins.  If you have a catheter to drain urine from your bladder.  If you are on a ventilator. A ventilator is a machine that helps you breathe by moving air in and out of your lungs.  After surgery. What are the risks? Risks of using CHG include:  A skin reaction.  Hearing loss, if CHG gets in your ears.  Eye injury, if CHG gets in your eyes and is not rinsed out.  The CHG product catching fire. Make sure that you avoid smoking and flames after applying CHG to your skin. Do not use CHG:  If you have a chlorhexidine allergy or have previously reacted to chlorhexidine.  On babies younger than 622 months of age. How to use CHG solution  Use CHG only as told by your health care provider, and follow the instructions on the label.  Use the full amount of CHG as directed. Usually, this is one bottle. During a shower Follow these steps when using CHG solution during a shower (unless your health care provider gives you different instructions): 1. Start the shower. 2. Use your normal soap and shampoo to wash your face and hair. 3. Turn off the shower or move out of the shower stream. 4. Pour the CHG onto a clean washcloth. Do not use any type of brush or rough-edged sponge. 5. Starting at your neck, lather your body down to your toes. Make sure you follow these instructions: ? If you will be having surgery, pay special attention to the part of your body where you will be having surgery.  Scrub this area for at least 1 minute. ? Do not use CHG on your head or face. If the solution gets into your ears or eyes, rinse them well with water. ? Avoid your genital area. ? Avoid any areas of skin that have broken skin, cuts, or scrapes. ? Scrub your back and under your arms. Make sure to wash skin folds. 6. Let the lather sit on your skin for 1-2 minutes or as long as told by your health care provider. 7. Thoroughly rinse your entire body in the shower. Make sure that all body creases and crevices are rinsed well. 8. Dry off with a clean towel. Do not put any substances on your body afterward--such as powder, lotion, or perfume--unless you are told to do so by your health care provider. Only use lotions that are recommended by the manufacturer. 9. Put on clean clothes or pajamas. 10. If it is the night before your surgery, sleep in clean sheets.  During a sponge bath Follow these steps when using CHG solution during a sponge bath (unless your health care provider gives you different instructions): 1. Use your normal soap and shampoo to wash your face and hair. 2. Pour the CHG onto a clean washcloth. 3. Starting at your neck, lather your body down to your toes. Make sure you follow these instructions: ? If you will be having surgery, pay special attention to the part of your body where you will be having surgery. Scrub this area for at least 1 minute. ? Do not use CHG on your head or face. If the solution gets into your ears or eyes, rinse them well with water. ? Avoid your genital area. ? Avoid any areas of skin that have broken skin, cuts, or scrapes. ? Scrub your back and under your arms. Make sure to wash skin folds. 4. Let the lather sit  on your skin for 1-2 minutes or as long as told by your health care provider. 5. Using a different clean, wet washcloth, thoroughly rinse your entire body. Make sure that all body creases and crevices are rinsed well. 6. Dry off with a clean towel.  Do not put any substances on your body afterward--such as powder, lotion, or perfume--unless you are told to do so by your health care provider. Only use lotions that are recommended by the manufacturer. 7. Put on clean clothes or pajamas. 8. If it is the night before your surgery, sleep in clean sheets. How to use CHG prepackaged cloths  Only use CHG cloths as told by your health care provider, and follow the instructions on the label.  Use the CHG cloth on clean, dry skin.  Do not use the CHG cloth on your head or face unless your health care provider tells you to.  When washing with the CHG cloth: ? Avoid your genital area. ? Avoid any areas of skin that have broken skin, cuts, or scrapes. Before surgery Follow these steps when using a CHG cloth to clean before surgery (unless your health care provider gives you different instructions): 1. Using the CHG cloth, vigorously scrub the part of your body where you will be having surgery. Scrub using a back-and-forth motion for 3 minutes. The area on your body should be completely wet with CHG when you are done scrubbing. 2. Do not rinse. Discard the cloth and let the area air-dry. Do not put any substances on the area afterward, such as powder, lotion, or perfume. 3. Put on clean clothes or pajamas. 4. If it is the night before your surgery, sleep in clean sheets.  For general bathing Follow these steps when using CHG cloths for general bathing (unless your health care provider gives you different instructions). 1. Use a separate CHG cloth for each area of your body. Make sure you wash between any folds of skin and between your fingers and toes. Wash your body in the following order, switching to a new cloth after each step: ? The front of your neck, shoulders, and chest. ? Both of your arms, under your arms, and your hands. ? Your stomach and groin area, avoiding the genitals. ? Your right leg and foot. ? Your left leg and foot. ? The back  of your neck, your back, and your buttocks. 2. Do not rinse. Discard the cloth and let the area air-dry. Do not put any substances on your body afterward--such as powder, lotion, or perfume--unless you are told to do so by your health care provider. Only use lotions that are recommended by the manufacturer. 3. Put on clean clothes or pajamas. Contact a health care provider if:  Your skin gets irritated after scrubbing.  You have questions about using your solution or cloth. Get help right away if:  Your eyes become very red or swollen.  Your eyes itch badly.  Your skin itches badly and is red or swollen.  Your hearing changes.  You have trouble seeing.  You have swelling or tingling in your mouth or throat.  You have trouble breathing.  You swallow any chlorhexidine. Summary  Chlorhexidine gluconate (CHG) is a germ-killing (antiseptic) solution that is used to clean the skin. Cleaning your skin with CHG may help to lower your risk for infection.  You may be given CHG to use for bathing. It may be in a bottle or in a prepackaged cloth to use on your skin.  Carefully follow your health care provider's instructions and the instructions on the product label.  Do not use CHG if you have a chlorhexidine allergy.  Contact your health care provider if your skin gets irritated after scrubbing. This information is not intended to replace advice given to you by your health care provider. Make sure you discuss any questions you have with your health care provider. Document Released: 06/03/2012 Document Revised: 11/26/2018 Document Reviewed: 08/07/2017 Elsevier Patient Education  2020 [employer].

## 2019-04-29 ENCOUNTER — Encounter (HOSPITAL_COMMUNITY)
Admission: RE | Admit: 2019-04-29 | Discharge: 2019-04-29 | Disposition: A | Discharge status: Home / Self Care | Payer: Medicaid Other | Source: Ambulatory Visit | Attending: General Surgery | Admitting: General Surgery

## 2019-04-29 ENCOUNTER — Other Ambulatory Visit: Payer: Self-pay

## 2019-04-29 ENCOUNTER — Encounter (HOSPITAL_COMMUNITY): Payer: Self-pay

## 2019-04-29 ENCOUNTER — Other Ambulatory Visit (HOSPITAL_COMMUNITY): Payer: Medicaid Other

## 2019-04-29 DIAGNOSIS — Z01812 Encounter for preprocedural laboratory examination: Secondary | ICD-10-CM | POA: Insufficient documentation

## 2019-04-29 LAB — BASIC METABOLIC PANEL
Anion gap: 9 (ref 5–15)
BUN: 7 mg/dL (ref 6–20)
CO2: 23 mmol/L (ref 22–32)
Calcium: 9.4 mg/dL (ref 8.9–10.3)
Chloride: 108 mmol/L (ref 98–111)
Creatinine, Ser: 0.58 mg/dL (ref 0.44–1.00)
GFR calc Af Amer: >60 mL/min (ref >60)
GFR calc non Af Amer: >60 mL/min (ref >60)
Glucose, Bld: 91 mg/dL (ref 70–99)
Potassium: 3.7 mmol/L (ref 3.5–5.1)
Sodium: 140 mmol/L (ref 135–145)

## 2019-04-29 LAB — CBC WITH DIFFERENTIAL/PLATELET
Abs Immature Granulocytes: 0.03 10*3/uL (ref 0.00–0.07)
Basophils Absolute: 0 10*3/uL (ref 0.0–0.1)
Basophils Relative: 0 %
Eosinophils Absolute: 0.1 10*3/uL (ref 0.0–0.5)
Eosinophils Relative: 1 %
HCT: 39.9 % (ref 36.0–46.0)
Hemoglobin: 13.3 g/dL (ref 12.0–15.0)
Immature Granulocytes: 0 %
Lymphocytes Relative: 30 %
Lymphs Abs: 3.1 10*3/uL (ref 0.7–4.0)
MCH: 30 pg (ref 26.0–34.0)
MCHC: 33.3 g/dL (ref 30.0–36.0)
MCV: 89.9 fL (ref 80.0–100.0)
Monocytes Absolute: 0.9 10*3/uL (ref 0.1–1.0)
Monocytes Relative: 9 %
Neutro Abs: 6.4 10*3/uL (ref 1.7–7.7)
Neutrophils Relative %: 60 %
Platelets: 368 10*3/uL (ref 150–400)
RBC: 4.44 MIL/uL (ref 3.87–5.11)
RDW: 12.9 % (ref 11.5–15.5)
WBC: 10.5 10*3/uL (ref 4.0–10.5)
nRBC: 0 % (ref 0.0–0.2)

## 2019-04-29 LAB — TYPE AND SCREEN
ABO/RH(D): O POS
Antibody Screen: NEGATIVE

## 2019-04-29 LAB — HCG, SERUM, QUALITATIVE: Preg, Serum: NEGATIVE

## 2019-05-03 ENCOUNTER — Inpatient Hospital Stay (HOSPITAL_COMMUNITY): Payer: Medicaid Other | Admitting: Anesthesiology

## 2019-05-03 ENCOUNTER — Encounter (HOSPITAL_COMMUNITY)
Admission: RE | Disposition: A | Discharge status: Home / Self Care | Payer: Self-pay | Source: Home / Self Care | Attending: General Surgery

## 2019-05-03 ENCOUNTER — Encounter (HOSPITAL_COMMUNITY): Payer: Self-pay | Admitting: *Deleted

## 2019-05-03 ENCOUNTER — Inpatient Hospital Stay (HOSPITAL_COMMUNITY)
Admission: RE | Admit: 2019-05-03 | Discharge: 2019-05-06 | DRG: 331 | Disposition: A | Discharge status: Home / Self Care | Payer: Medicaid Other | Attending: General Surgery | Admitting: General Surgery

## 2019-05-03 ENCOUNTER — Other Ambulatory Visit: Payer: Self-pay

## 2019-05-03 DIAGNOSIS — I10 Essential (primary) hypertension: Secondary | ICD-10-CM | POA: Diagnosis present

## 2019-05-03 DIAGNOSIS — Z8619 Personal history of other infectious and parasitic diseases: Secondary | ICD-10-CM | POA: Diagnosis not present

## 2019-05-03 DIAGNOSIS — K5720 Diverticulitis of large intestine with perforation and abscess without bleeding: Principal | ICD-10-CM | POA: Diagnosis present

## 2019-05-03 DIAGNOSIS — K5732 Diverticulitis of large intestine without perforation or abscess without bleeding: Secondary | ICD-10-CM | POA: Diagnosis present

## 2019-05-03 HISTORY — PX: LAPAROSCOPIC PARTIAL COLECTOMY: SHX5907

## 2019-05-03 SURGERY — LAPAROSCOPIC PARTIAL COLECTOMY
Anesthesia: General

## 2019-05-03 MED ORDER — CHLORHEXIDINE GLUCONATE CLOTH 2 % EX PADS: 6.0000 | MEDICATED_PAD | Freq: Once | CUTANEOUS | Status: DC

## 2019-05-03 MED ORDER — MIDAZOLAM HCL 2 MG/2ML IJ SOLN
INTRAMUSCULAR | Status: AC
  Filled 2019-05-03: qty 2

## 2019-05-03 MED ORDER — ONDANSETRON HCL 4 MG/2ML IJ SOLN: 4.0000 mg | Freq: Four times a day (QID) | INTRAMUSCULAR | Status: DC | PRN

## 2019-05-03 MED ORDER — CELECOXIB 100 MG PO CAPS
200.0000 mg | ORAL_CAPSULE | ORAL | Status: AC
  Administered 2019-05-03: 10:00:00 200 mg via ORAL
  Filled 2019-05-03: qty 2

## 2019-05-03 MED ORDER — ONDANSETRON 4 MG PO TBDP: 4.0000 mg | ORAL_TABLET | Freq: Four times a day (QID) | ORAL | Status: DC | PRN

## 2019-05-03 MED ORDER — ONDANSETRON HCL 4 MG/2ML IJ SOLN
INTRAMUSCULAR | Status: DC | PRN
  Administered 2019-05-03: 4 mg via INTRAVENOUS

## 2019-05-03 MED ORDER — SIMETHICONE 80 MG PO CHEW: 40.0000 mg | CHEWABLE_TABLET | Freq: Four times a day (QID) | ORAL | Status: DC | PRN

## 2019-05-03 MED ORDER — METOPROLOL SUCCINATE ER 50 MG PO TB24
50.0000 mg | ORAL_TABLET | Freq: Every day | ORAL | Status: DC
  Administered 2019-05-04 – 2019-05-06 (×3): 50 mg via ORAL
  Filled 2019-05-03 (×3): qty 1

## 2019-05-03 MED ORDER — BUPIVACAINE LIPOSOME 1.3 % IJ SUSP
INTRAMUSCULAR | Status: AC
  Filled 2019-05-03: qty 20

## 2019-05-03 MED ORDER — DEXAMETHASONE SODIUM PHOSPHATE 4 MG/ML IJ SOLN
INTRAMUSCULAR | Status: DC | PRN
  Administered 2019-05-03: 8 mg via INTRAVENOUS

## 2019-05-03 MED ORDER — SUGAMMADEX SODIUM 200 MG/2ML IV SOLN
INTRAVENOUS | Status: DC | PRN
  Administered 2019-05-03: 150.6 mg via INTRAVENOUS

## 2019-05-03 MED ORDER — METHOCARBAMOL 500 MG PO TABS: 500.0000 mg | ORAL_TABLET | Freq: Four times a day (QID) | ORAL | Status: DC | PRN

## 2019-05-03 MED ORDER — MIDAZOLAM HCL 2 MG/2ML IJ SOLN: 0.5000 mg | Freq: Once | INTRAMUSCULAR | Status: DC | PRN

## 2019-05-03 MED ORDER — FENTANYL CITRATE (PF) 250 MCG/5ML IJ SOLN
INTRAMUSCULAR | Status: AC
  Filled 2019-05-03: qty 5

## 2019-05-03 MED ORDER — ALVIMOPAN 12 MG PO CAPS
12.0000 mg | ORAL_CAPSULE | ORAL | Status: AC
  Administered 2019-05-03: 12 mg via ORAL
  Filled 2019-05-03: qty 1

## 2019-05-03 MED ORDER — METOPROLOL TARTRATE 5 MG/5ML IV SOLN: 5.0000 mg | Freq: Four times a day (QID) | INTRAVENOUS | Status: DC | PRN

## 2019-05-03 MED ORDER — DEXAMETHASONE SODIUM PHOSPHATE 10 MG/ML IJ SOLN
INTRAMUSCULAR | Status: AC
  Filled 2019-05-03: qty 1

## 2019-05-03 MED ORDER — SODIUM CHLORIDE 0.9 % IV SOLN: 2.0000 g | INTRAVENOUS | Status: DC

## 2019-05-03 MED ORDER — DIPHENHYDRAMINE HCL 50 MG/ML IJ SOLN: 12.5000 mg | Freq: Four times a day (QID) | INTRAMUSCULAR | Status: DC | PRN

## 2019-05-03 MED ORDER — PROPOFOL 10 MG/ML IV BOLUS
INTRAVENOUS | Status: DC | PRN
  Administered 2019-05-03: 150 mg via INTRAVENOUS

## 2019-05-03 MED ORDER — OXYCODONE HCL 5 MG PO TABS
5.0000 mg | ORAL_TABLET | ORAL | Status: DC | PRN
  Administered 2019-05-03 – 2019-05-04 (×4): 10 mg via ORAL
  Filled 2019-05-03 (×4): qty 2

## 2019-05-03 MED ORDER — PHENYLEPHRINE HCL (PRESSORS) 10 MG/ML IV SOLN
INTRAVENOUS | Status: DC | PRN
  Administered 2019-05-03 (×4): 80 ug via INTRAVENOUS

## 2019-05-03 MED ORDER — DOCUSATE SODIUM 100 MG PO CAPS
100.0000 mg | ORAL_CAPSULE | Freq: Two times a day (BID) | ORAL | Status: DC
  Administered 2019-05-03 – 2019-05-06 (×7): 100 mg via ORAL
  Filled 2019-05-03 (×7): qty 1

## 2019-05-03 MED ORDER — 0.9 % SODIUM CHLORIDE (POUR BTL) OPTIME
TOPICAL | Status: DC | PRN
  Administered 2019-05-03: 11:00:00 2000 mL

## 2019-05-03 MED ORDER — PROPOFOL 10 MG/ML IV BOLUS
INTRAVENOUS | Status: AC
  Filled 2019-05-03: qty 20

## 2019-05-03 MED ORDER — PANTOPRAZOLE SODIUM 40 MG IV SOLR
40.0000 mg | Freq: Every day | INTRAVENOUS | Status: DC
  Administered 2019-05-03 – 2019-05-05 (×3): 40 mg via INTRAVENOUS
  Filled 2019-05-03 (×3): qty 40

## 2019-05-03 MED ORDER — ONDANSETRON HCL 4 MG/2ML IJ SOLN
INTRAMUSCULAR | Status: AC
  Filled 2019-05-03: qty 2

## 2019-05-03 MED ORDER — GABAPENTIN 300 MG PO CAPS
300.0000 mg | ORAL_CAPSULE | Freq: Two times a day (BID) | ORAL | Status: DC
  Administered 2019-05-03 – 2019-05-06 (×6): 300 mg via ORAL
  Filled 2019-05-03 (×7): qty 1

## 2019-05-03 MED ORDER — MORPHINE SULFATE (PF) 2 MG/ML IV SOLN: 2.0000 mg | INTRAVENOUS | Status: DC | PRN

## 2019-05-03 MED ORDER — LACTATED RINGERS IV SOLN
INTRAVENOUS | Status: DC
  Administered 2019-05-03: 11:00:00 via INTRAVENOUS
  Administered 2019-05-03: 10:00:00 1000 mL via INTRAVENOUS
  Administered 2019-05-03: 13:00:00 via INTRAVENOUS

## 2019-05-03 MED ORDER — SUCCINYLCHOLINE CHLORIDE 200 MG/10ML IV SOSY
PREFILLED_SYRINGE | INTRAVENOUS | Status: AC
  Filled 2019-05-03: qty 10

## 2019-05-03 MED ORDER — ROCURONIUM BROMIDE 10 MG/ML (PF) SYRINGE
PREFILLED_SYRINGE | INTRAVENOUS | Status: AC
  Filled 2019-05-03: qty 10

## 2019-05-03 MED ORDER — ACETAMINOPHEN 500 MG PO TABS
1000.0000 mg | ORAL_TABLET | Freq: Four times a day (QID) | ORAL | Status: DC
  Administered 2019-05-03 – 2019-05-06 (×10): 1000 mg via ORAL
  Filled 2019-05-03 (×11): qty 2

## 2019-05-03 MED ORDER — DIPHENHYDRAMINE HCL 12.5 MG/5ML PO ELIX: 12.5000 mg | ORAL_SOLUTION | Freq: Four times a day (QID) | ORAL | Status: DC | PRN

## 2019-05-03 MED ORDER — ACETAMINOPHEN 500 MG PO TABS
1000.0000 mg | ORAL_TABLET | ORAL | Status: AC
  Administered 2019-05-03: 1000 mg via ORAL
  Filled 2019-05-03: qty 2

## 2019-05-03 MED ORDER — ROCURONIUM BROMIDE 50 MG/5ML IV SOSY
PREFILLED_SYRINGE | INTRAVENOUS | Status: DC | PRN
  Administered 2019-05-03: 20 mg via INTRAVENOUS
  Administered 2019-05-03: 40 mg via INTRAVENOUS

## 2019-05-03 MED ORDER — HEPARIN SODIUM (PORCINE) 5000 UNIT/ML IJ SOLN
5000.0000 [IU] | Freq: Three times a day (TID) | INTRAMUSCULAR | Status: DC
  Administered 2019-05-03 – 2019-05-05 (×4): 5000 [IU] via SUBCUTANEOUS
  Filled 2019-05-03 (×4): qty 1

## 2019-05-03 MED ORDER — SODIUM CHLORIDE 0.9 % IV SOLN
2.0000 g | INTRAVENOUS | Status: AC
  Administered 2019-05-03: 2 g via INTRAVENOUS
  Filled 2019-05-03: qty 2

## 2019-05-03 MED ORDER — HEPARIN SODIUM (PORCINE) 5000 UNIT/ML IJ SOLN
5000.0000 [IU] | Freq: Once | INTRAMUSCULAR | Status: AC
  Administered 2019-05-03: 5000 [IU] via SUBCUTANEOUS
  Filled 2019-05-03: qty 1

## 2019-05-03 MED ORDER — LIDOCAINE 2% (20 MG/ML) 5 ML SYRINGE
INTRAMUSCULAR | Status: AC
  Filled 2019-05-03: qty 5

## 2019-05-03 MED ORDER — HYDROMORPHONE HCL 1 MG/ML IJ SOLN
0.2500 mg | INTRAMUSCULAR | Status: DC | PRN
  Administered 2019-05-03 (×2): 0.5 mg via INTRAVENOUS
  Filled 2019-05-03 (×2): qty 0.5

## 2019-05-03 MED ORDER — LACTATED RINGERS IV SOLN
INTRAVENOUS | Status: DC
  Administered 2019-05-03 – 2019-05-04 (×2): via INTRAVENOUS

## 2019-05-03 MED ORDER — KETOROLAC TROMETHAMINE 30 MG/ML IJ SOLN
30.0000 mg | Freq: Four times a day (QID) | INTRAMUSCULAR | Status: AC
  Administered 2019-05-03: 15:00:00 30 mg via INTRAVENOUS
  Filled 2019-05-03: qty 1

## 2019-05-03 MED ORDER — PROMETHAZINE HCL 25 MG/ML IJ SOLN: 6.2500 mg | INTRAMUSCULAR | Status: DC | PRN

## 2019-05-03 MED ORDER — BUPIVACAINE LIPOSOME 1.3 % IJ SUSP
INTRAMUSCULAR | Status: DC | PRN
  Administered 2019-05-03: 20 mL

## 2019-05-03 MED ORDER — ROCURONIUM BROMIDE 100 MG/10ML IV SOLN
INTRAVENOUS | Status: DC | PRN
  Administered 2019-05-03: 20 mg via INTRAVENOUS

## 2019-05-03 MED ORDER — FENTANYL CITRATE (PF) 100 MCG/2ML IJ SOLN
INTRAMUSCULAR | Status: DC | PRN
  Administered 2019-05-03 (×3): 50 ug via INTRAVENOUS
  Administered 2019-05-03: 100 ug via INTRAVENOUS
  Administered 2019-05-03 (×3): 50 ug via INTRAVENOUS

## 2019-05-03 MED ORDER — LIDOCAINE 2% (20 MG/ML) 5 ML SYRINGE
INTRAMUSCULAR | Status: DC | PRN
  Administered 2019-05-03: 40 mg via INTRAVENOUS

## 2019-05-03 MED ORDER — ALVIMOPAN 12 MG PO CAPS
12.0000 mg | ORAL_CAPSULE | Freq: Two times a day (BID) | ORAL | Status: DC
  Administered 2019-05-04 (×2): 12 mg via ORAL
  Filled 2019-05-03 (×3): qty 1

## 2019-05-03 MED ORDER — SODIUM CHLORIDE 0.9 % IV SOLN
2.0000 g | Freq: Two times a day (BID) | INTRAVENOUS | Status: AC
  Administered 2019-05-03 – 2019-05-05 (×5): 2 g via INTRAVENOUS
  Filled 2019-05-03 (×5): qty 2

## 2019-05-03 MED ORDER — GLYCOPYRROLATE PF 0.2 MG/ML IJ SOSY
PREFILLED_SYRINGE | INTRAMUSCULAR | Status: AC
  Filled 2019-05-03: qty 1

## 2019-05-03 MED ORDER — MIDAZOLAM HCL 5 MG/5ML IJ SOLN
INTRAMUSCULAR | Status: DC | PRN
  Administered 2019-05-03: 2 mg via INTRAVENOUS

## 2019-05-03 MED ORDER — SUCCINYLCHOLINE CHLORIDE 20 MG/ML IJ SOLN
INTRAMUSCULAR | Status: DC | PRN
  Administered 2019-05-03: 160 mg via INTRAVENOUS

## 2019-05-03 MED ORDER — ZOLPIDEM TARTRATE 5 MG PO TABS: 5.0000 mg | ORAL_TABLET | Freq: Every evening | ORAL | Status: DC | PRN

## 2019-05-03 MED ORDER — GLYCOPYRROLATE PF 0.2 MG/ML IJ SOSY
PREFILLED_SYRINGE | INTRAMUSCULAR | Status: DC | PRN
  Administered 2019-05-03: .2 mg via INTRAVENOUS

## 2019-05-03 MED ORDER — HYDROCODONE-ACETAMINOPHEN 7.5-325 MG PO TABS: 1.0000 | ORAL_TABLET | Freq: Once | ORAL | Status: DC | PRN

## 2019-05-03 MED ORDER — KETOROLAC TROMETHAMINE 30 MG/ML IJ SOLN: 30.0000 mg | Freq: Four times a day (QID) | INTRAMUSCULAR | Status: DC | PRN

## 2019-05-03 SURGICAL SUPPLY — 48 items
CELLS DAT CNTRL 66122 CELL SVR (MISCELLANEOUS) ×1 IMPLANT
COVER LIGHT HANDLE STERIS (MISCELLANEOUS) ×4 IMPLANT
COVER WAND RF STERILE (DRAPES) ×2 IMPLANT
DRSG OPSITE POSTOP 4X8 (GAUZE/BANDAGES/DRESSINGS) ×2 IMPLANT
DRSG TEGADERM 2-3/8X2-3/4 SM (GAUZE/BANDAGES/DRESSINGS) ×6 IMPLANT
ELECT REM PT RETURN 9FT ADLT (ELECTROSURGICAL) ×2
ELECTRODE REM PT RTRN 9FT ADLT (ELECTROSURGICAL) ×1 IMPLANT
FILTER SMOKE EVAC LAPAROSHD (FILTER) ×2 IMPLANT
GLOVE BIO SURGEON STRL SZ 6.5 (GLOVE) ×4 IMPLANT
GLOVE BIOGEL M 7.0 STRL (GLOVE) ×8 IMPLANT
GLOVE BIOGEL PI IND STRL 6.5 (GLOVE) ×4 IMPLANT
GLOVE BIOGEL PI IND STRL 7.0 (GLOVE) ×8 IMPLANT
GLOVE BIOGEL PI INDICATOR 6.5 (GLOVE) ×4
GLOVE BIOGEL PI INDICATOR 7.0 (GLOVE) ×8
GLOVE ECLIPSE 6.5 STRL STRAW (GLOVE) ×4 IMPLANT
GLOVE SURG SS PI 7.5 STRL IVOR (GLOVE) ×4 IMPLANT
GOWN STRL REUS W/ TWL XL LVL3 (GOWN DISPOSABLE) ×2 IMPLANT
GOWN STRL REUS W/TWL LRG LVL3 (GOWN DISPOSABLE) ×16 IMPLANT
GOWN STRL REUS W/TWL XL LVL3 (GOWN DISPOSABLE) ×2
INST SET LAPROSCOPIC AP (KITS) ×2 IMPLANT
INST SET MAJOR GENERAL (KITS) ×2 IMPLANT
KIT TURNOVER CYSTO (KITS) ×2 IMPLANT
LIGASURE LAP ATLAS 10MM 37CM (INSTRUMENTS) ×2 IMPLANT
MANIFOLD NEPTUNE II (INSTRUMENTS) ×2 IMPLANT
NEEDLE HYPO 18GX1.5 BLUNT FILL (NEEDLE) ×2 IMPLANT
NEEDLE HYPO 22GX1.5 SAFETY (NEEDLE) ×2 IMPLANT
NS IRRIG 1000ML POUR BTL (IV SOLUTION) ×6 IMPLANT
PACK COLON (CUSTOM PROCEDURE TRAY) ×2 IMPLANT
PAD ARMBOARD 7.5X6 YLW CONV (MISCELLANEOUS) ×2 IMPLANT
PENCIL HANDSWITCHING (ELECTRODE) ×2 IMPLANT
RELOAD PROXIMATE 75MM BLUE (ENDOMECHANICALS) ×4 IMPLANT
RTRCTR WOUND ALEXIS 18CM MED (MISCELLANEOUS) ×2
SPONGE GAUZE 2X2 8PLY STRL LF (GAUZE/BANDAGES/DRESSINGS) ×4 IMPLANT
SPONGE LAP 18X18 RF (DISPOSABLE) ×4 IMPLANT
STAPLER GUN LINEAR PROX 60 (STAPLE) ×2 IMPLANT
STAPLER PROXIMATE 75MM BLUE (STAPLE) ×2 IMPLANT
STAPLER VISISTAT (STAPLE) ×2 IMPLANT
SUT PDS AB CT VIOLET #0 27IN (SUTURE) ×4 IMPLANT
SUT SILK 3 0 SH CR/8 (SUTURE) ×4 IMPLANT
SYR 10ML LL (SYRINGE) ×2 IMPLANT
SYR 20ML LL LF (SYRINGE) ×4 IMPLANT
TRAY FOLEY MTR SLVR 16FR STAT (SET/KITS/TRAYS/PACK) ×2 IMPLANT
TROCAR ENDO BLADELESS 11MM (ENDOMECHANICALS) ×2 IMPLANT
TROCAR XCEL NON-BLD 5MMX100MML (ENDOMECHANICALS) ×4 IMPLANT
TROCAR XCEL UNIV SLVE 11M 100M (ENDOMECHANICALS) ×2 IMPLANT
TUBING INSUFFLATION (TUBING) ×2 IMPLANT
WARMER LAPAROSCOPE (MISCELLANEOUS) ×2 IMPLANT
YANKAUER SUCT BULB TIP 10FT TU (MISCELLANEOUS) ×4 IMPLANT

## 2019-05-03 NOTE — Op Note (Signed)
[facility] Operative Note  05/03/19  Preoperative Diagnosis:  Sigmoid diverticulitis with perforation and abscess    Postoperative Diagnosis: Same   Procedure(s) Performed:  Laparoscopic sigmoid colectomy with primary extracorporeal side to side anastomosis    Surgeon: Leatrice JewelsLindsay C. Henreitta LeberBridges, MD   Assistants: Franky MachoMark Jenkins, MD     Anesthesia: General endotracheal   Anesthesiologist: Dorena CookeyNabonsal, Jeff S, MD    Specimens:  Sigmoid colon   Estimated Blood Loss: Minimal   Blood Replacement: None    Complications: None   Wound Class: Contaminated (small abscess between sigmoid and bladder)    Operative Indications: Ms. 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 She has been having a recurrent issue after her first presentation. She was sent to me for evaluation to see if surgical intervention was warranted. We discussed her complicated diverticulitis with abscess and IR drainage, and potential for this to occur again, and she opted to proceed with elective surgery. She had been COVID positive June, and has been asymptomatic for weeks (> 21 days), due to her < 3 month positive we did not repeat a COVID test per the [facility] regulations at this time.  We discussed the risk of surgery including bleeding, infection, leak from the anastomosis, need for colostomy, risk of injury to the ureter, and also discussed that she would need a colonoscopy in follow up after her surgery.  She was low risk of having any malignancy, so I felt it was reasonable to proceed with surgery to prevent any reinfection/ inflammation prior to surgery.   Findings: sigmoid colon with diverticula, minimal diverticula noted distally in the distal sigmoid/ rectosigmoid junction, adherent sigmoid to dome of bladder with small 2cm pocket of purulence' possibly sterile but unknown    Procedure: The patient was taken to the operating room and placed supine. General  endotracheal anesthesia was induced. Intravenous antibiotics were administered per protocol.  An orogastric tube positioned to decompress the stomach. A foley was placed. She was then placed in lithotomy with all pressure points padded and the right arm tucked.  The abdomen and peritoneum was prepared and draped in the usual sterile fashion pending need for EEA stapler.   A supraumbilical incision was made and elevated the abdominal wall with a towel clip. The Veress needle was inserted, and the saline drop test and low insulffation pressures ensured intraperitoneal location. Pneumoperitoneum was achieved to 15 mm hg.  A 11 mm optiview trocar was placed under direct visualization. No injury was noted from the Veress or trocar. The patient was placed in Trendelenburg position with the left side up. Additional trocars were placed in the right lower quadrant, 11mm and the right upper quadrant, 5 mm.  The small bowel was swept into the right upper quadrant. The sigmoid and descending colon were noted to be relatively healthy. There was some inflammation leading to the adherent part of the sigmoid with some omentum between the sigmoid and the dome of the bladder where the prior abscess was located. Using great care, I dissected off the sigmoid from the dome of the bladder with a combination of blunt dissection and electrocautery/ sharp scissors.  There was a small pocket that was entered that had < 5 cc of yellow/ purulent material. This was suctioned.  This freed up the sigmoid colon further. I was able to take down the white line of Toldt from the pelvic inlet to the splenic flexure with the Ligasure device. Care was taken  to identify and protect the left ureter.  The colon was very redundant. There were not significant notable diverticula in the descending colon that we could appreciate.  After this was done, the sigmoid colon could be elevated superiorly to allow for opening of the mesentery and dissection from the  medial side.  An additional 5 mm trocar was placed in the left upper quadrant, and Dr. Arnoldo Morale elevated the sigmoid colon so that the mesentery tented.  Omentum that was attached to the sigmoid was ligated with the Ligasure leaving a small remnant. I score this down into the pelvis on the left and right side, and started to create a window using traction/ counter traction at the level of the sacral promontory.  This was opened, and I ensured that we were above the ureter on the left.  From here the Ligasure was used to divide the mesentery superiorly and inferiorly along the sigmoid colon. I stayed higher on the mesentery given that this was not a cancer surgery to ensure no injury to the ureter.  From here the entire mesentery had been ligated, the colon was mobilized and free. The part of the sigmoid colon that needed to be resected was mobile to the lower pelvis. Given this it was going to be easy to do a Pfannenstiel incision and resect the colon and perform a side to side anastomosis as the rectosigmoid junction had no obvious diverticular disease, and the patient had been noted to have diverticula throughout her colon on CT, so our ability to remove all disease is limited.   A pfannenstiel incision was made and carried down through the subcutaneous tissue. The fascia was split vertically per usual.The peritoneum was entered with care and pneumoperitoneum was released. The trocars were removed.  The colon was brought out through the incision after a wound protector was placed. This was draped out. The mesentery had already been ligated. The distal and proximal ends were divided with linear cutting stapler 75 mm standard loads. The colon was not twisted, and laid nicely side to side with the descending lateral side coming down nicely to form a side to side anastomosis with the distal portion. The area was toweled off, and colotomies were made. The linear cutting stapler was placed ensuring the mesentery  would not be divided and no twisting of the colon. The common channel was made. A TA standard load was used to close the common colotomy. The crotch was protected with a 3-0 silk suture, and the common staple line was oversewn with Lembert 3-0 Silk suture. The abundance of epiploic fat was laid over the anastomosis, as was the omentum once the specimen was tucked back into the pelvis. There was no tension on the anastomosis.  The pelvis was irrigated.   At this time the entire team changed gowns and gloves and the closing tray was used.  The pfannenstiel was closed with 0 PDS suture and staples. The Trocar sites were closed with staples.   Dr. Arnoldo Morale was assisting throughout the procedure and was present for the critical portions of the case.   Final inspection revealed acceptable hemostasis. All counts were correct at the end of the case. The patient was awakened from anesthesia and extubated without complication.  The patient went to the PACU in stable condition.   Curlene Labrum, MD [facility] [address] [place], [place] [address] [phone] (office)

## 2019-05-03 NOTE — Progress Notes (Signed)
[facility]  Looking good postop. No complaints.  Curlene Labrum, MD [facility] [address] [place], [place] [address] [phone] (office)

## 2019-05-03 NOTE — Transfer of Care (Signed)
Immediate Anesthesia Transfer of Care Note  Patient: Megan West  Procedure(s) Performed: LAPAROSCOPIC PARTIAL COLECTOMY (N/A )  Patient Location: PACU  Anesthesia Type:General  Level of Consciousness: awake, oriented and patient cooperative  Airway & Oxygen Therapy: Patient Spontanous Breathing  Post-op Assessment: Report given to RN and Post -op Vital signs reviewed and stable  Post vital signs: Reviewed and stable  Last Vitals:  Vitals Value Taken Time  BP 121/81 05/03/19 1330  Temp    Pulse 99 05/03/19 1332  Resp 19 05/03/19 1332  SpO2 100 % 05/03/19 1332  Vitals shown include unvalidated device data.  Last Pain:  Vitals:   05/03/19 0927  TempSrc: Oral  PainSc: 0-No pain      Patients Stated Pain Goal: 9 (51/76/16 0737)  Complications: No apparent anesthesia complications

## 2019-05-03 NOTE — Interval H&P Note (Signed)
History and Physical Interval Note:  05/03/2019 10:33 AM  Megan West  has presented today for surgery, with the diagnosis of diverticulitis.  The various methods of treatment have been discussed with the patient and family. After consideration of risks, benefits and other options for treatment, the patient has consented to  Procedure(s): LAPAROSCOPIC PARTIAL COLECTOMY (N/A) as a surgical intervention.  The patient's history has been reviewed, patient examined, no change in status, stable for surgery.  I have reviewed the patient's chart and labs.  Questions were answered to the patient's satisfaction.    No further questions. Bowel prep went well, having thin greenish light stool. Antibiotics completed orally.  Virl Cagey

## 2019-05-03 NOTE — Anesthesia Postprocedure Evaluation (Signed)
Anesthesia Post Note  Patient: Megan West  Procedure(s) Performed: LAPAROSCOPIC PARTIAL COLECTOMY (N/A )  Patient location during evaluation: PACU Anesthesia Type: General Level of consciousness: awake and alert and oriented Pain management: pain level controlled Vital Signs Assessment: post-procedure vital signs reviewed and stable Respiratory status: spontaneous breathing Cardiovascular status: stable Postop Assessment: no apparent nausea or vomiting Anesthetic complications: no     Last Vitals:  Vitals:   05/03/19 1345 05/03/19 1400  BP: (!) 122/93 112/84  Pulse: 95 87  Resp: 17 17  Temp:    SpO2: 98% 95%    Last Pain:  Vitals:   05/03/19 1400  TempSrc:   PainSc: 5                  Kano Heckmann A

## 2019-05-03 NOTE — Anesthesia Preprocedure Evaluation (Signed)
Anesthesia Evaluation  Patient identified by MRN, date of birth, ID band Patient awake    Reviewed: Allergy & Precautions, NPO status , Patient's Chart, lab work & pertinent test results, reviewed documented beta blocker date and time   Airway Mallampati: I  TM Distance: >3 FB Neck ROM: Full    Dental no notable dental hx. (+) Teeth Intact   Pulmonary neg pulmonary ROS,    Pulmonary exam normal breath sounds clear to auscultation       Cardiovascular Exercise Tolerance: Good hypertension, Pt. on medications and Pt. on home beta blockers negative cardio ROS Normal cardiovascular examI Rhythm:Regular Rate:Normal     Neuro/Psych negative neurological ROS  negative psych ROS   GI/Hepatic negative GI ROS, Neg liver ROS, Recurrent Diverticulitis issues    Endo/Other  negative endocrine ROS  Renal/GU negative Renal ROS  negative genitourinary   Musculoskeletal negative musculoskeletal ROS (+)   Abdominal   Peds negative pediatric ROS (+)  Hematology negative hematology ROS (+)   Anesthesia Other Findings   Reproductive/Obstetrics negative OB ROS                             Anesthesia Physical Anesthesia Plan  ASA: II  Anesthesia Plan: General   Post-op Pain Management:    Induction: Intravenous  PONV Risk Score and Plan: 3 and Treatment may vary due to age or medical condition, Dexamethasone, Ondansetron and Midazolam  Airway Management Planned: Oral ETT  Additional Equipment:   Intra-op Plan:   Post-operative Plan: Extubation in OR  Informed Consent: I have reviewed the patients History and Physical, chart, labs and discussed the procedure including the risks, benefits and alternatives for the proposed anesthesia with the patient or authorized representative who has indicated his/her understanding and acceptance.     Dental advisory given  Plan Discussed with:  CRNA  Anesthesia Plan Comments: (Plan Full PPE use  Plan GETA -d/w pt -WTP with same after Q&A)        Anesthesia Quick Evaluation

## 2019-05-03 NOTE — Anesthesia Procedure Notes (Signed)
Procedure Name: Intubation Date/Time: 05/03/2019 10:47 AM Performed by: Andree Elk, Amy A, CRNA Pre-anesthesia Checklist: Patient identified, Patient being monitored, Timeout performed, Emergency Drugs available and Suction available Patient Re-evaluated:Patient Re-evaluated prior to induction Oxygen Delivery Method: Circle system utilized Preoxygenation: Pre-oxygenation with 100% oxygen Induction Type: IV induction Ventilation: Mask ventilation without difficulty Laryngoscope Size: Mac and 3 Grade View: Grade I Tube type: Oral Tube size: 7.0 mm Number of attempts: 1 Airway Equipment and Method: Stylet Placement Confirmation: ETT inserted through vocal cords under direct vision,  positive ETCO2 and breath sounds checked- equal and bilateral Secured at: 21 cm Tube secured with: Tape Dental Injury: Teeth and Oropharynx as per pre-operative assessment

## 2019-05-04 ENCOUNTER — Encounter (HOSPITAL_COMMUNITY): Payer: Self-pay | Admitting: General Surgery

## 2019-05-04 LAB — BASIC METABOLIC PANEL
Anion gap: 9 (ref 5–15)
BUN: 7 mg/dL (ref 6–20)
CO2: 22 mmol/L (ref 22–32)
Calcium: 8.8 mg/dL — ABNORMAL LOW (ref 8.9–10.3)
Chloride: 108 mmol/L (ref 98–111)
Creatinine, Ser: 0.66 mg/dL (ref 0.44–1.00)
GFR calc Af Amer: >60 mL/min (ref >60)
GFR calc non Af Amer: >60 mL/min (ref >60)
Glucose, Bld: 92 mg/dL (ref 70–99)
Potassium: 3.5 mmol/L (ref 3.5–5.1)
Sodium: 139 mmol/L (ref 135–145)

## 2019-05-04 LAB — CBC WITH DIFFERENTIAL/PLATELET
Abs Immature Granulocytes: 0.08 10*3/uL — ABNORMAL HIGH (ref 0.00–0.07)
Basophils Absolute: 0 10*3/uL (ref 0.0–0.1)
Basophils Relative: 0 %
Eosinophils Absolute: 0 10*3/uL (ref 0.0–0.5)
Eosinophils Relative: 0 %
HCT: 35.2 % — ABNORMAL LOW (ref 36.0–46.0)
Hemoglobin: 11.7 g/dL — ABNORMAL LOW (ref 12.0–15.0)
Immature Granulocytes: 1 %
Lymphocytes Relative: 16 %
Lymphs Abs: 2.4 10*3/uL (ref 0.7–4.0)
MCH: 30.2 pg (ref 26.0–34.0)
MCHC: 33.2 g/dL (ref 30.0–36.0)
MCV: 90.7 fL (ref 80.0–100.0)
Monocytes Absolute: 1.2 10*3/uL — ABNORMAL HIGH (ref 0.1–1.0)
Monocytes Relative: 8 %
Neutro Abs: 11.6 10*3/uL — ABNORMAL HIGH (ref 1.7–7.7)
Neutrophils Relative %: 75 %
Platelets: 335 10*3/uL (ref 150–400)
RBC: 3.88 MIL/uL (ref 3.87–5.11)
RDW: 12.7 % (ref 11.5–15.5)
WBC: 15.4 10*3/uL — ABNORMAL HIGH (ref 4.0–10.5)
nRBC: 0 % (ref 0.0–0.2)

## 2019-05-04 LAB — PHOSPHORUS: Phosphorus: 3.4 mg/dL (ref 2.5–4.6)

## 2019-05-04 LAB — MAGNESIUM: Magnesium: 1.9 mg/dL (ref 1.7–2.4)

## 2019-05-04 MED ORDER — SODIUM CHLORIDE 0.9 % IV SOLN: INTRAVENOUS | Status: DC | PRN

## 2019-05-04 MED ORDER — POTASSIUM CHLORIDE 20 MEQ PO PACK
40.0000 meq | PACK | Freq: Once | ORAL | Status: AC
  Administered 2019-05-04: 40 meq via ORAL
  Filled 2019-05-04: qty 2

## 2019-05-04 NOTE — Anesthesia Postprocedure Evaluation (Signed)
Anesthesia Post Note  Patient: Megan West  Procedure(s) Performed: LAPAROSCOPIC PARTIAL COLECTOMY (N/A )  Patient location during evaluation: Nursing Unit Anesthesia Type: General Level of consciousness: awake and alert, oriented and patient cooperative Pain management: pain level controlled Vital Signs Assessment: post-procedure vital signs reviewed and stable Respiratory status: spontaneous breathing Cardiovascular status: stable Postop Assessment: no apparent nausea or vomiting Anesthetic complications: no     Last Vitals:  Vitals:   05/04/19 0200 05/04/19 0600  BP: 113/70 115/83  Pulse: 90 (!) 103  Resp: 16 18  Temp: 37.2 C 36.9 C  SpO2: 99% 99%    Last Pain:  Vitals:   05/04/19 0600  TempSrc: Oral  PainSc:                  ADAMS, AMY A

## 2019-05-04 NOTE — Addendum Note (Signed)
Addendum  created 05/04/19 0900 by Mickel Baas, CRNA   Clinical Note Signed

## 2019-05-04 NOTE — Progress Notes (Signed)
[facility] Progress Note  1 Day Post-Op  Subjective: Doing well this AM. UOP good. Says she is sore but pain controlled. Feels like she is having some gas. Thinks BM will occur soon. No nausea reported.   Objective: Vital signs in last 24 hours: Temp:  [97.7 F (36.5 C)-99.4 F (37.4 C)] 98.4 F (36.9 C) (08/11 1359) Pulse Rate:  [77-103] 86 (08/11 1359) Resp:  [16-18] 18 (08/11 1359) BP: (106-128)/(68-85) 106/81 (08/11 1359) SpO2:  [98 %-100 %] 99 % (08/11 1359)    Intake/Output from previous day: 08/10 0701 - 08/11 0700 In: 3742.9 [I.V.:3642.9; IV Piggyback:100] Out: 2680 [Urine:2650; Blood:30] Intake/Output this shift: Total I/O In: 720 [P.O.:720] Out: -   General appearance: alert, cooperative and no distress Resp: normal work of breathing GI: soft, appropriately distended, appropriately tender, dressing in place, honeycomb without staining  Lab Results:  Recent Labs    05/04/19 0552  WBC 15.4*  HGB 11.7*  HCT 35.2*  PLT 335   BMET Recent Labs    05/04/19 0552  NA 139  K 3.5  CL 108  CO2 22  GLUCOSE 92  BUN 7  CREATININE 0.66  CALCIUM 8.8*    Anti-infectives: Anti-infectives (From admission, onward)   Start     Dose/Rate Route Frequency Ordered Stop   05/03/19 2200  cefoTEtan (CEFOTAN) 2 g in sodium chloride 0.9 % 100 mL IVPB     2 g 200 mL/hr over 30 Minutes Intravenous Every 12 hours 05/03/19 1432 05/06/19 0959   05/03/19 0945  cefoTEtan (CEFOTAN) 2 g in sodium chloride 0.9 % 100 mL IVPB  Status:  Discontinued     2 g 200 mL/hr over 30 Minutes Intravenous On call to O.R. 05/03/19 8676 05/03/19 0950   05/03/19 0945  cefoTEtan (CEFOTAN) 2 g in sodium chloride 0.9 % 100 mL IVPB     2 g 200 mL/hr over 30 Minutes Intravenous On call to O.R. 05/03/19 0938 05/03/19 1118      Assessment/Plan: [name] is a 33 yo POD 1 s/p Lap partial colectomy for diverticulitis. Doing great.  Scheduled toradol, tylenol, PRN narcotics IS,  OOB Ambulate HD ok Clear diet until bowel function Mild leukocytosis, prophylaxis cefotetan post op H&H drifted, no signs of bleeding, may have bloody BM on first few BMs K replaced, LR @ 75 SCDs, heparin sq    LOS: 1 day    Virl Cagey 05/04/2019

## 2019-05-05 LAB — BASIC METABOLIC PANEL
Anion gap: 10 (ref 5–15)
BUN: 7 mg/dL (ref 6–20)
CO2: 23 mmol/L (ref 22–32)
Calcium: 8.7 mg/dL — ABNORMAL LOW (ref 8.9–10.3)
Chloride: 106 mmol/L (ref 98–111)
Creatinine, Ser: 0.71 mg/dL (ref 0.44–1.00)
GFR calc Af Amer: >60 mL/min (ref >60)
GFR calc non Af Amer: >60 mL/min (ref >60)
Glucose, Bld: 84 mg/dL (ref 70–99)
Potassium: 3.2 mmol/L — ABNORMAL LOW (ref 3.5–5.1)
Sodium: 139 mmol/L (ref 135–145)

## 2019-05-05 LAB — CBC WITH DIFFERENTIAL/PLATELET
Abs Immature Granulocytes: 0.05 10*3/uL (ref 0.00–0.07)
Basophils Absolute: 0 10*3/uL (ref 0.0–0.1)
Basophils Relative: 0 %
Eosinophils Absolute: 0 10*3/uL (ref 0.0–0.5)
Eosinophils Relative: 0 %
HCT: 36 % (ref 36.0–46.0)
Hemoglobin: 12 g/dL (ref 12.0–15.0)
Immature Granulocytes: 1 %
Lymphocytes Relative: 31 %
Lymphs Abs: 3.2 10*3/uL (ref 0.7–4.0)
MCH: 30.7 pg (ref 26.0–34.0)
MCHC: 33.3 g/dL (ref 30.0–36.0)
MCV: 92.1 fL (ref 80.0–100.0)
Monocytes Absolute: 0.9 10*3/uL (ref 0.1–1.0)
Monocytes Relative: 9 %
Neutro Abs: 6 10*3/uL (ref 1.7–7.7)
Neutrophils Relative %: 59 %
Platelets: 295 10*3/uL (ref 150–400)
RBC: 3.91 MIL/uL (ref 3.87–5.11)
RDW: 13.1 % (ref 11.5–15.5)
WBC: 10.3 10*3/uL (ref 4.0–10.5)
nRBC: 0 % (ref 0.0–0.2)

## 2019-05-05 LAB — MAGNESIUM: Magnesium: 1.9 mg/dL (ref 1.7–2.4)

## 2019-05-05 LAB — PHOSPHORUS: Phosphorus: 2.8 mg/dL (ref 2.5–4.6)

## 2019-05-05 MED ORDER — IBUPROFEN 600 MG PO TABS: 600.0000 mg | ORAL_TABLET | Freq: Four times a day (QID) | ORAL | Status: DC | PRN

## 2019-05-05 MED ORDER — ENOXAPARIN SODIUM 40 MG/0.4ML ~~LOC~~ SOLN
40.0000 mg | SUBCUTANEOUS | Status: DC
  Administered 2019-05-05: 40 mg via SUBCUTANEOUS
  Filled 2019-05-05: qty 0.4

## 2019-05-05 MED ORDER — POTASSIUM CHLORIDE 20 MEQ PO PACK
40.0000 meq | PACK | Freq: Two times a day (BID) | ORAL | Status: AC
  Administered 2019-05-05 – 2019-05-06 (×2): 40 meq via ORAL
  Filled 2019-05-05 (×2): qty 2

## 2019-05-05 NOTE — Progress Notes (Signed)
Ate about half of soft diet lunch and denies any increased pain or nausea.  Has been ambulating in room

## 2019-05-05 NOTE — Progress Notes (Signed)
[facility] Progress Note  2 Days Post-Op  Subjective: Looking great. Tolerating soft diet after RN reported she had Bms today. Ambulating. Feeling good. Pain controlled with orals. Urinating without issues.   Objective: Vital signs in last 24 hours: Temp:  [98.2 F (36.8 C)-98.5 F (36.9 C)] 98.4 F (36.9 C) (08/12 1300) Pulse Rate:  [84-105] 105 (08/12 1300) Resp:  [16-18] 18 (08/12 1300) BP: (120-128)/(85-92) 120/85 (08/12 1300) SpO2:  [98 %-100 %] 100 % (08/12 1300) Last BM Date: 05/05/19  Intake/Output from previous day: 08/11 0701 - 08/12 0700 In: 1080 [P.O.:1080] Out: -  Intake/Output this shift: No intake/output data recorded.  General appearance: alert, cooperative and no distress Resp: normal work of breathing GI: soft, mildly distended binder in place, honeycomb without staining or erythema  Lab Results:  Recent Labs    05/04/19 0552 05/05/19 0610  WBC 15.4* 10.3  HGB 11.7* 12.0  HCT 35.2* 36.0  PLT 335 295   BMET Recent Labs    05/04/19 0552 05/05/19 0610  NA 139 139  K 3.5 3.2*  CL 108 106  CO2 22 23  GLUCOSE 92 84  BUN 7 7  CREATININE 0.66 0.71  CALCIUM 8.8* 8.7*      Assessment/Plan: Megan West is a 33 yo POD 2 s/p Lap partial colectomy for diverticulitis. Doing great.   Stopped Toradal, Scheduled tylenol, ibuprofen PRN and PRN narcotics IS, OOB Ambulate HD ok Soft diet Resolved leukocytosis, prophylaxis cefotetan post op H&H stable, no bleeding with Bms   K replaced, LR off  SCDs, heparin sq will switch to lovenox  Possible home in next 2 days    LOS: 2 days    Virl Cagey 05/05/2019

## 2019-05-05 NOTE — Progress Notes (Signed)
Patient ambulated at least 100 feet unsupervised without any distress.

## 2019-05-06 LAB — BASIC METABOLIC PANEL
Anion gap: 8 (ref 5–15)
BUN: 8 mg/dL (ref 6–20)
CO2: 24 mmol/L (ref 22–32)
Calcium: 8.9 mg/dL (ref 8.9–10.3)
Chloride: 106 mmol/L (ref 98–111)
Creatinine, Ser: 0.73 mg/dL (ref 0.44–1.00)
GFR calc Af Amer: >60 mL/min (ref >60)
GFR calc non Af Amer: >60 mL/min (ref >60)
Glucose, Bld: 92 mg/dL (ref 70–99)
Potassium: 3.5 mmol/L (ref 3.5–5.1)
Sodium: 138 mmol/L (ref 135–145)

## 2019-05-06 LAB — PHOSPHORUS: Phosphorus: 3.5 mg/dL (ref 2.5–4.6)

## 2019-05-06 LAB — MAGNESIUM: Magnesium: 1.9 mg/dL (ref 1.7–2.4)

## 2019-05-06 MED ORDER — DOCUSATE SODIUM 100 MG PO CAPS: 100.0000 mg | ORAL_CAPSULE | Freq: Two times a day (BID) | ORAL | 2 refills | Status: DC

## 2019-05-06 MED ORDER — OXYCODONE HCL 5 MG PO TABS: 5.0000 mg | ORAL_TABLET | ORAL | 0 refills | Status: DC | PRN

## 2019-05-06 MED ORDER — ONDANSETRON 4 MG PO TBDP: 4.0000 mg | ORAL_TABLET | Freq: Four times a day (QID) | ORAL | 0 refills | Status: DC | PRN

## 2019-05-06 MED ORDER — PANTOPRAZOLE SODIUM 40 MG PO TBEC: 40.0000 mg | DELAYED_RELEASE_TABLET | Freq: Every day | ORAL | Status: DC

## 2019-05-06 NOTE — Discharge Instructions (Signed)
Discharge Open Abdominal Surgery Instructions: ° °Common Complaints: °Pain at the incision site is common. This will improve with time. Take your pain medications as described below. °Some nausea is common and poor appetite. The main goal is to stay hydrated the first few days after surgery.  ° °Diet/ Activity: °Diet as tolerated. You have started and tolerated a diet in the hospital, and should continue to increase what you are able to eat.   °You may not have a large appetite, but it is important to stay hydrated. Drink 64 ounces of water a day. Your appetite will return with time.  °Keep a dry dressing in place over your staples daily or as needed. Some minor pink/ blood tinged drainage is expected. This will stop in a few days after surgery.  °Shower per your regular routine daily.  Do not take hot showers. Take warm showers that are less than 10 minutes. Path the incision dry. °Wear an abdominal binder daily with activity. You do not have to wear this while sleeping or sitting.  °Rest and listen to your body, but do not remain in bed all day.  °Walk everyday for at least 15-20 minutes. Deep cough and move around every 1-2 hours in the first few days after surgery.  °Do not lift > 10 lbs, perform excessive bending, pushing, pulling, squatting for 6-8 weeks after surgery.  °The activity restrictions and the abdominal binder are to prevent hernia formation at your incision while you are healing.  °Do not place lotions or balms on your incision unless instructed to specifically by Dr. Olga Bourbeau.  ° °Medication: °Take tylenol and ibuprofen as needed for pain control, alternating every 4-6 hours.  °Example:  °Tylenol 1000mg @ 6am, 12noon, 6pm, 12midnight (Do not exceed 4000mg of tylenol a day). Ibuprofen 800mg @ 9am, 3pm, 9pm, 3am (Do not exceed 3600mg of ibuprofen a day).  °Take Roxicodone for breakthrough pain every 4 hours.  °Take Colace for constipation related to narcotic pain medication. If you do not have a  bowel movement in 2 days, take Miralax over the counter.  °Drink plenty of water to also prevent constipation.  ° °Contact Information: °If you have questions or concerns, please call our office, [phone], Monday- Thursday 8AM-5PM and Friday 8AM-12Noon.  °If it is after hours or on the weekend, please call [facility], [phone], and ask to speak to the surgeon on call for Dr. Ancelmo Hunt at [place].  ° ° °Laparoscopic Colectomy, Care After °This sheet gives you information about how to care for yourself after your procedure. Your health care provider may also give you more specific instructions. If you have problems or questions, contact your health care provider. °What can I expect after the procedure? °After your procedure, it is common to have the following: °· Pain in your abdomen, especially in the incision areas. You will be given medicine to control the pain. °· Tiredness. This is a normal part of the recovery process. Your energy level will return to normal over the next several weeks. °· Changes in your bowel movements, such as constipation or needing to go more often. Talk with your health care provider about how to manage this. °Follow these instructions at home: °Medicines °· Take over-the-counter and prescription medicines only as told by your health care provider. °· Do not drive or use heavy machinery while taking prescription pain medicine. °· Do not drink alcohol while taking prescription pain medicine. °· If you were prescribed an antibiotic medicine, use it as   told by your health care provider. Do not stop using the antibiotic even if you start to feel better. °Incision care ° °· Follow instructions from your health care provider about how to take care of your incision areas. Make sure you: °? Keep your incisions clean and dry. °? Wash your hands with soap and water before and after applying medicine to the areas, and before and after changing your bandage (dressing). If soap and  water are not available, use hand sanitizer. °? Change your dressing as told by your health care provider. °? Leave stitches (sutures), skin glue, or adhesive strips in place. These skin closures may need to stay in place for 2 weeks or longer. If adhesive strip edges start to loosen and curl up, you may trim the loose edges. Do not remove adhesive strips completely unless your health care provider tells you to do that. °· Do not wear tight clothing over the incisions. Tight clothing may rub and irritate the incision areas, which may cause the incisions to open. °· Do not take baths, swim, or use a hot tub until your health care provider approves.  °· You may shower. °· Check your incision area every day for signs of infection. Check for: °? More redness, swelling, or pain. °? More fluid or blood. °? Warmth. °? Pus or a bad smell. °Activity °· Avoid lifting anything that is heavier than 10 lb (4.5 kg) for 2 weeks or until your health care provider says it is okay. °· You may resume normal activities as told by your health care provider. Ask your health care provider what activities are safe for you. °· Take rest breaks during the day as needed. °Eating and drinking °· Follow instructions from your health care provider about what you can eat after surgery. °· To prevent or treat constipation while you are taking prescription pain medicine, your health care provider may recommend that you: °? Drink enough fluid to keep your urine clear or pale yellow. °? Take over-the-counter or prescription medicines. °? Eat foods that are high in fiber, such as fresh fruits and vegetables, whole grains, and beans. °? Limit foods that are high in fat and processed sugars, such as fried and sweet foods. °General instructions °· Ask your health care provider when you will need an appointment to get your sutures or staples removed. °· Keep all follow-up visits as told by your health care provider. This is important. °Contact a health  care provider if: °· You have more redness, swelling, or pain around your incisions. °· You have more fluid or blood coming from the incisions. °· Your incisions feel warm to the touch. °· You have pus or a bad smell coming from your incisions or your dressing. °· You have a fever. °· You have an incision that breaks open (edges not staying together) after sutures or staples have been removed. °Get help right away if: °· You develop a rash. °· You have chest pain or difficulty breathing. °· You have pain or swelling in your legs. °· You feel light-headed or you faint. °· Your abdomen swells (becomes distended). °· You have nausea or vomiting. °· You have blood in your stool (feces). °This information is not intended to replace advice given to you by your health care provider. Make sure you discuss any questions you have with your health care provider. °Document Released: 03/29/2005 Document Revised: 05/29/2018 Document Reviewed: 06/10/2016 °Elsevier Patient Education © 2020 [employer] ° °

## 2019-05-06 NOTE — Discharge Summary (Signed)
Physician Discharge Summary  Patient ID: Megan West MRN: [id number] DOB/AGE: 06-18-1986 33 y.o.  Admit date: 05/03/2019 Discharge date: 05/06/2019  Admission Diagnoses: Sigmoid diverticulitis with prior abscess   Discharge Diagnoses:  Principal Problem:   Diverticulitis of colon Active Problems:   Sigmoid diverticulitis   Discharged Condition: good  Hospital Course: Megan West is a 33 yo who has a history of diverticulitis and abscess formation. She had prior IR drainage of the abscess and continued to have issues with pain and irritation/ thickening of the colon and abscess cavity on imaging. She underwent a laparoscopic sigmoid colectomy with primary extracorporeal side to side anastomosis on 05/03/19. She has done wonderfully post op. She has had good pain control, and has had return of bowel function. She was able to eat and drink and her diet was advanced. She had adequate pain control with oral medication, and was wanting to be discharged home to be with her family.  The patient had been COVID positive in June and did not require retesting prior to surgery as per protocol. She has never had a colonoscopy and after speaking with Dr. Oneida Alar and Walden Field NP preoperatively, we decided to pursue this after surgery as it would be unlikely to change our management with surgery given her age and history. Will plan to get this in 6-8 weeks.   Pathology: Diagnosis Colon, segmental resection, sigmoid - SEGMENT OF COLON (13 CM) WITH WALL THICKENING, INFLAMMATION AND SEROSITIS CONTAINING VEGETABLE MATERIAL AND GIANT CELL REACTION - MARGINS APPEAR VIABLE - BENIGN LYMPH NODES  No extensive diverticular disease noted, one large tic with perforation   Consults: None  Significant Diagnostic Studies: Daily labs wnl   Treatments: Surgery: 8/10- Laparoscopic sigmoid colectomy with extracorporeal side to side anastomosis   Discharge Exam: Blood pressure (!) 131/95, pulse 97, temperature  98.1 F (36.7 C), temperature source Oral, resp. rate 17, height 5' (1.524 m), weight 181 lb (82.1 kg), SpO2 100 %. General appearance: alert, cooperative and no distress Resp: normal work of breathing GI: soft, minimally distended, abd binder in place, staples c/d/i with no erythema or drainage, dressings removed Extremities: extremities normal, atraumatic, no cyanosis or edema  Disposition:    Discharge Instructions    Call MD for:  difficulty breathing, headache or visual disturbances   Complete by: As directed    Call MD for:  extreme fatigue   Complete by: As directed    Call MD for:  persistant dizziness or light-headedness   Complete by: As directed    Call MD for:  persistant nausea and vomiting   Complete by: As directed    Call MD for:  redness, tenderness, or signs of infection (pain, swelling, redness, odor or green/yellow discharge around incision site)   Complete by: As directed    Call MD for:  severe uncontrolled pain   Complete by: As directed    Call MD for:  temperature >100.4   Complete by: As directed    Increase activity slowly   Complete by: As directed      Allergies as of 05/06/2019   No Known Allergies     Medication List    TAKE these medications   docusate sodium 100 MG capsule Commonly known as: COLACE Take 1 capsule (100 mg total) by mouth 2 (two) times daily.   medroxyPROGESTERone 150 MG/ML injection Commonly known as: DEPO-PROVERA Inject 150 mg into the muscle every 3 (three) months.   metoprolol succinate 50 MG 24 hr tablet Commonly known  as: TOPROL-XL Take 50 mg by mouth daily.   ondansetron 4 MG disintegrating tablet Commonly known as: ZOFRAN-ODT Take 1 tablet (4 mg total) by mouth every 6 (six) hours as needed for nausea.   oxyCODONE 5 MG immediate release tablet Commonly known as: Oxy IR/ROXICODONE Take 1-2 tablets (5-10 mg total) by mouth every 4 (four) hours as needed for moderate pain.      Follow-up Information     Franky MachoJenkins, Mark, MD Follow up on 05/18/2019.   Specialty: General Surgery Why: staple removal  Contact information: [address] [place] [facility] [id number] [phone]           Future Appointments  Date Time Provider Department Center  05/12/2019  1:30 PM Anice [facility]aganiniGill[facility] A[facility]  05/18/2019 10:15 AM Franky MachoJenkins, Mark, MD [facility] None  06/08/2019 10:00 AM Lucretia RoersBridges, Lindsay C, MD [facility] None   Signed: Lucretia RoersLindsay C Bridges 05/06/2019, 9:47 PM

## 2019-05-06 NOTE — Progress Notes (Signed)
IV remove,d d/c instructions reviewed with patient. Transported to private vehicle via wheelchair.

## 2019-05-06 NOTE — Progress Notes (Signed)
The patient is receiving Protonix by the intravenous route.  Based on criteria approved by the Pharmacy and [facility], the medication is being converted to the equivalent oral dose form.  These criteria include: -No active GI bleeding -Able to tolerate diet of full liquids (or better) or tube feeding -Able to tolerate other medications by the oral or enteral route  If you have any questions about this conversion, please contact the Pharmacy Department (phone [phone]).  Thank you. Isac Sarna, BS [place], BCPS Clinical Pharmacist Pager [phone]

## 2019-05-12 ENCOUNTER — Ambulatory Visit: Payer: Medicaid Other | Admitting: Nurse Practitioner

## 2019-05-18 ENCOUNTER — Encounter: Payer: Self-pay | Admitting: General Surgery

## 2019-05-18 ENCOUNTER — Ambulatory Visit (INDEPENDENT_AMBULATORY_CARE_PROVIDER_SITE_OTHER): Payer: Self-pay | Admitting: General Surgery

## 2019-05-18 ENCOUNTER — Other Ambulatory Visit: Payer: Self-pay

## 2019-05-18 VITALS — BP 115/82 | HR 90 | Temp 97.1°F | Resp 16 | Ht 61.0 in | Wt 165.0 lb

## 2019-05-18 DIAGNOSIS — Z09 Encounter for follow-up examination after completed treatment for conditions other than malignant neoplasm: Secondary | ICD-10-CM

## 2019-05-18 NOTE — Progress Notes (Signed)
Subjective:     Megan West  Patient here for postoperative visit.  States she is doing well.  Denies any fever or chills.  Her bowel movements are regular now. Denies any significant incisional pain. Objective:    BP 115/82 (BP Location: Left Arm, Patient Position: Sitting, Cuff Size: Normal)   Pulse 90   Temp (!) 97.1 F (36.2 C) (Tympanic)   Resp 16   Ht 5\' 1"  (1.549 m)   Wt 165 lb (74.8 kg)   SpO2 98%   BMI 31.18 kg/m   General:  alert, cooperative and no distress  Abdomen soft, incisions healing well.  Staples removed.     Assessment:    Doing well postoperatively.    Plan:   May gradually resume normal activity.  May stop wearing abdominal binder.  Follow-up here as needed.

## 2019-06-08 ENCOUNTER — Ambulatory Visit: Payer: Self-pay | Admitting: General Surgery

## 2019-06-10 ENCOUNTER — Encounter: Payer: Self-pay | Admitting: General Surgery

## 2019-06-10 ENCOUNTER — Other Ambulatory Visit: Payer: Self-pay

## 2019-06-10 ENCOUNTER — Ambulatory Visit (INDEPENDENT_AMBULATORY_CARE_PROVIDER_SITE_OTHER): Payer: Self-pay | Admitting: General Surgery

## 2019-06-10 VITALS — BP 124/87 | HR 88 | Temp 98.0°F | Resp 16 | Ht 61.0 in | Wt 166.0 lb

## 2019-06-10 DIAGNOSIS — K5732 Diverticulitis of large intestine without perforation or abscess without bleeding: Secondary | ICD-10-CM

## 2019-06-10 NOTE — Patient Instructions (Signed)
Activity and diet as tolerated. Colonoscopy after surgery 2-3 months out.

## 2019-06-10 NOTE — Progress Notes (Signed)
[facility] Note   HPI:  33 y.o. Female presents to clinic for post-op follow-up evaluation of after laparoscopic sigmoid colectomy. She is doing well. She is having regular BMs and eating well. She has some left upper abdominal pain still on occassion.  It is random and can occur at night when she is lying on that side.   Review of Systems:  No fever or chills Reg BMs Eating regular diet All other review of systems: otherwise negative   Vital Signs:  BP 124/87 (BP Location: Right Arm, Patient Position: Sitting, Cuff Size: Normal)   Pulse 88   Temp 98 F (36.7 C) (Tympanic)   Resp 16   Ht 5\' 1"  (1.549 m)   Wt 166 lb (75.3 kg)   SpO2 98%   BMI 31.37 kg/m    Physical Exam:  Physical Exam Vitals signs reviewed.  HENT:     Head: Normocephalic.  Cardiovascular:     Rate and Rhythm: Normal rate.  Pulmonary:     Effort: Pulmonary effort is normal.  Abdominal:     General: Abdomen is flat.     Palpations: Abdomen is soft.     Tenderness: There is no abdominal tenderness.     Hernia: No hernia is present.     Comments: Healing port sites and pfannenstiel incision, no erythema or drainage  Neurological:     Mental Status: She is alert.    Pathology: Diagnosis Colon, segmental resection, sigmoid - SEGMENT OF COLON (13 CM) WITH WALL THICKENING, INFLAMMATION AND SEROSITIS CONTAINING VEGETABLE MATERIAL AND GIANT CELL REACTION - MARGINS APPEAR VIABLE - BENIGN LYMPH NODES  Assessment:  33 y.o. yo Female s/p laparoscopic sigmoid colectomy for diverticulitis. Doing well overall. She has never had a colonoscopy and will need one post op. She is seeing Walden Field in a few weeks.  Plan:  Activity and diet as tolerated. Colonoscopy after surgery 2-3 months out.  All of the above recommendations were discussed with the patient, and all of patient's questions were answered to her expressed satisfaction.  Curlene Labrum, MD [facility] [address] [place], [place] [address] [phone] (office)

## 2019-06-15 ENCOUNTER — Ambulatory Visit: Payer: Medicaid Other | Admitting: Nurse Practitioner

## 2019-06-15 ENCOUNTER — Telehealth: Payer: Self-pay | Admitting: Gastroenterology

## 2019-06-15 ENCOUNTER — Encounter: Payer: Self-pay | Admitting: Gastroenterology

## 2019-06-15 ENCOUNTER — Telehealth: Payer: Self-pay | Admitting: Nurse Practitioner

## 2019-06-15 NOTE — Telephone Encounter (Signed)
PATIENT WAS A NO SHOW AND LETTER SENT  °

## 2019-06-15 NOTE — Telephone Encounter (Signed)
Please call patient to schedule post-surgical colonoscopy 2-3 months.

## 2019-06-15 NOTE — Progress Notes (Deleted)
Referring Provider: Lianne Moris, PA-C Primary Care Physician:  Lianne Moris, PA-C Primary GI:  Dr. Darrick Penna  No chief complaint on file.   HPI:   Megan West is a 33 y.o. female who presents for follow-up on diverticulitis.  The patient has a history of recurrent diverticulitis.  Most recently this occurred in June 2020.  Recommended postpone colonoscopy until referred with surgeon.  She saw Dr. Algis Greenhouse on 04/20/2019 for recurrent sigmoid diverticulitis.  3 episodes that she is aware of, 2 of which required admission.  She was admitted in February at [facility] and required an abscess that had to be drained.  Her drain was subsequently removed.  Colonoscopy initially scheduled to [facility] was canceled due to COVID-19.  She was initially scheduled for colonoscopy but this had to be canceled due to recurrence of symptoms and CT demonstrating continued fluid collection concerning for an abscess.  She was again treated with antibiotics and was referred to surgery.  Due to low risk for other pathology and continued fluid collection it was elected to proceed with laparoscopic partial colectomy followed by colonoscopy.  She underwent surgery 05/03/2019 which found sigmoid colon with diverticula, minimal diverticula noted distally in the distal sigmoid/rectosigmoid junction, adherent sigmoid to dome of bladder with small 2 cm pocket of purulence possibly sterile but unknown.  Partial colectomy with side-to-side anastomosis of the rectosigmoid junction that was free from obvious diverticular disease.  However, CT found diverticula in the entire colon so difficult to remove all disease.  Surgical follow-up 06/10/2019 when the patient was noted to be doing well, regular bowel movements and eating well.  Some intermittent left upper abdominal pain on occasion that is random and can occur at night when lying on her side.  Recommended activity and diet as tolerated, postoperative colonoscopy 2 to 3  months out.  Today she states   Past Medical History:  Diagnosis Date  . Diverticulitis    2019 & 10/2018 (with abscess)  . Hypertension     Past Surgical History:  Procedure Laterality Date  . LAPAROSCOPIC PARTIAL COLECTOMY N/A 05/03/2019   Procedure: LAPAROSCOPIC PARTIAL COLECTOMY;  Surgeon: Lucretia Roers, MD;  Location: [facility];  Service: General;  Laterality: N/A;    Current Outpatient Medications  Medication Sig Dispense Refill  . docusate sodium (COLACE) 100 MG capsule Take 1 capsule (100 mg total) by mouth 2 (two) times daily. 60 capsule 2  . medroxyPROGESTERone (DEPO-PROVERA) 150 MG/ML injection Inject 150 mg into the muscle every 3 (three) months.     . metoprolol succinate (TOPROL-XL) 50 MG 24 hr tablet Take 50 mg by mouth daily.    . ondansetron (ZOFRAN-ODT) 4 MG disintegrating tablet Take 1 tablet (4 mg total) by mouth every 6 (six) hours as needed for nausea. (Patient not taking: Reported on 06/10/2019) 20 tablet 0  . oxyCODONE (OXY IR/ROXICODONE) 5 MG immediate release tablet Take 1-2 tablets (5-10 mg total) by mouth every 4 (four) hours as needed for moderate pain. (Patient not taking: Reported on 06/10/2019) 30 tablet 0   No current facility-administered medications for this visit.     Allergies as of 06/15/2019  . (No Known Allergies)    Family History  Problem Relation Age of Onset  . Colon cancer Neg Hx     Social History   Socioeconomic History  . Marital status: Single    Spouse name: Not on file  . Number of children: Not on file  . Years of education:  Not on file  . Highest education level: Not on file  Occupational History  . Not on file  Social Needs  . Financial resource strain: Not on file  . Food insecurity    Worry: Not on file    Inability: Not on file  . Transportation needs    Medical: Not on file    Non-medical: Not on file  Tobacco Use  . Smoking status: Never Smoker  . Smokeless tobacco: Never Used  Substance and Sexual  Activity  . Alcohol use: Not Currently  . Drug use: Never  . Sexual activity: Yes    Birth control/protection: Injection  Lifestyle  . Physical activity    Days per week: Not on file    Minutes per session: Not on file  . Stress: Not on file  Relationships  . Social Herbalist on phone: Not on file    Gets together: Not on file    Attends religious service: Not on file    Active member of club or organization: Not on file    Attends meetings of clubs or organizations: Not on file    Relationship status: Not on file  Other Topics Concern  . Not on file  Social History Narrative  . Not on file    Review of Systems: Complete ROS negative except as per HPI.   Physical Exam: There were no vitals taken for this visit. General:   Alert and oriented. Pleasant and cooperative. Well-nourished and well-developed.  Head:  Normocephalic and atraumatic. Eyes:  Without icterus, sclera clear and conjunctiva pink.  Ears:  Normal auditory acuity. Mouth:  No deformity or lesions, oral mucosa pink.  Throat/Neck:  Supple, without mass or thyromegaly. Cardiovascular:  S1, S2 present without murmurs appreciated. Normal pulses noted. Extremities without clubbing or edema. Respiratory:  Clear to auscultation bilaterally. No wheezes, rales, or rhonchi. No distress.  Gastrointestinal:  +BS, soft, non-tender and non-distended. No HSM noted. No guarding or rebound. No masses appreciated.  Rectal:  Deferred  Musculoskalatal:  Symmetrical without gross deformities. Normal posture. Skin:  Intact without significant lesions or rashes. Neurologic:  Alert and oriented x4;  grossly normal neurologically. Psych:  Alert and cooperative. Normal mood and affect. Heme/Lymph/Immune: No significant cervical adenopathy. No excessive bruising noted.    06/15/2019 8:08 AM   Disclaimer: This note was dictated with voice recognition software. Similar sounding words can inadvertently be transcribed and  may not be corrected upon review.

## 2019-06-24 ENCOUNTER — Ambulatory Visit (INDEPENDENT_AMBULATORY_CARE_PROVIDER_SITE_OTHER): Payer: Medicaid Other | Admitting: Gastroenterology

## 2019-06-24 ENCOUNTER — Ambulatory Visit: Payer: Medicaid Other | Admitting: Nurse Practitioner

## 2019-06-24 ENCOUNTER — Encounter: Payer: Self-pay | Admitting: Gastroenterology

## 2019-06-24 ENCOUNTER — Other Ambulatory Visit: Payer: Self-pay

## 2019-06-24 ENCOUNTER — Telehealth: Payer: Self-pay

## 2019-06-24 VITALS — BP 126/88 | HR 92 | Temp 96.9°F | Ht 60.0 in | Wt 168.2 lb

## 2019-06-24 DIAGNOSIS — Z8719 Personal history of other diseases of the digestive system: Secondary | ICD-10-CM

## 2019-06-24 DIAGNOSIS — R1010 Upper abdominal pain, unspecified: Secondary | ICD-10-CM | POA: Diagnosis not present

## 2019-06-24 DIAGNOSIS — K5732 Diverticulitis of large intestine without perforation or abscess without bleeding: Secondary | ICD-10-CM | POA: Diagnosis not present

## 2019-06-24 LAB — CBC WITH DIFFERENTIAL/PLATELET
Absolute Monocytes: 662 cells/uL (ref 200–950)
Basophils Absolute: 23 cells/uL (ref 0–200)
Basophils Relative: 0.3 %
Eosinophils Absolute: 39 cells/uL (ref 15–500)
Eosinophils Relative: 0.5 %
HCT: 39.5 % (ref 35.0–45.0)
Hemoglobin: 13.1 g/dL (ref 11.7–15.5)
Lymphs Abs: 2787 cells/uL (ref 850–3900)
MCH: 29.6 pg (ref 27.0–33.0)
MCHC: 33.2 g/dL (ref 32.0–36.0)
MCV: 89.4 fL (ref 80.0–100.0)
MPV: 9.6 fL (ref 7.5–12.5)
Monocytes Relative: 8.6 %
Neutro Abs: 4189 cells/uL (ref 1500–7800)
Neutrophils Relative %: 54.4 %
Platelets: 359 10*3/uL (ref 140–400)
RBC: 4.42 10*6/uL (ref 3.80–5.10)
RDW: 12.6 % (ref 11.0–15.0)
Total Lymphocyte: 36.2 %
WBC: 7.7 10*3/uL (ref 3.8–10.8)

## 2019-06-24 LAB — COMPLETE METABOLIC PANEL WITH GFR
AG Ratio: 1.5 (calc) (ref 1.0–2.5)
ALT: 15 U/L (ref 6–29)
AST: 15 U/L (ref 10–30)
Albumin: 4.4 g/dL (ref 3.6–5.1)
Alkaline phosphatase (APISO): 60 U/L (ref 31–125)
BUN: 7 mg/dL (ref 7–25)
CO2: 26 mmol/L (ref 20–32)
Calcium: 9.7 mg/dL (ref 8.6–10.2)
Chloride: 106 mmol/L (ref 98–110)
Creat: 0.72 mg/dL (ref 0.50–1.10)
GFR, Est African American: 127 mL/min/{1.73_m2} (ref >=60)
GFR, Est Non African American: 109 mL/min/{1.73_m2} (ref >=60)
Globulin: 3 g/dL (calc) (ref 1.9–3.7)
Glucose, Bld: 99 mg/dL (ref 65–99)
Potassium: 4.3 mmol/L (ref 3.5–5.3)
Sodium: 140 mmol/L (ref 135–146)
Total Bilirubin: 0.6 mg/dL (ref 0.2–1.2)
Total Protein: 7.4 g/dL (ref 6.1–8.1)

## 2019-06-24 MED ORDER — PANTOPRAZOLE SODIUM 40 MG PO TBEC: 40.0000 mg | DELAYED_RELEASE_TABLET | Freq: Every day | ORAL | 3 refills | Status: DC

## 2019-06-24 MED ORDER — CLENPIQ 10-3.5-12 MG-GM -GM/160ML PO SOLN: 1.0000 | Freq: Once | ORAL | 0 refills | Status: AC

## 2019-06-24 NOTE — Telephone Encounter (Signed)
Called pt, TCS w/[facility] scheduled for 09/10/19 at 1:45pm. COVID test scheduled for 09/08/19 at 3:00pm. Pt aware to quarantine at home after the test until procedure. Rx for prep sent to pharmacy. Orders entered. Appt letter and procedure instructions mailed.

## 2019-06-24 NOTE — Progress Notes (Signed)
Referring Provider: Lianne Moris, PA-C Primary Care Physician:  Lianne Moris, PA-C Primary GI: Dr. Darrick Penna   Chief Complaint  Patient presents with  . Abdominal Pain    comes/goes, LUQ/RUQ x 2 nights getting worse. ongoing since surgery-heating pad not helping    HPI:   Megan West is a 33 y.o. female presenting today with a history of multiple episodes of diverticulitis and contained fluid collection, s/p laparoscopic sigmoid colectomy in Aug 2020. Pathology from resection with segment of colon with wall thickening inflammation, and serositis containing vegetable material and giant cell reaction. Viable margins. Benign lymph nodes.   LUQ and RUQ pain intermittently, unprovoked by eating. Ate small bowl of loaded potato soup and cornbread yesterday. No ETOH, no smoking. Seems worse at night. Lays in hot water. Intermittent nausea with HTN. No fever or chills. Pain present prior to surgery. +GERD. Takes Tums for reflux. Taking Tums every few days. Rare NSAIDs.   Past Medical History:  Diagnosis Date  . Diverticulitis    2019 & 10/2018 (with abscess)  . Hypertension     Past Surgical History:  Procedure Laterality Date  . LAPAROSCOPIC PARTIAL COLECTOMY N/A 05/03/2019   Procedure: LAPAROSCOPIC PARTIAL COLECTOMY;  Surgeon: Lucretia Roers, MD;  Location: [facility];  Service: General;  Laterality: N/A;    Current Outpatient Medications  Medication Sig Dispense Refill  . medroxyPROGESTERone (DEPO-PROVERA) 150 MG/ML injection Inject 150 mg into the muscle every 3 (three) months.     . metoprolol succinate (TOPROL-XL) 100 MG 24 hr tablet Take 100 mg by mouth daily.     . ondansetron (ZOFRAN-ODT) 4 MG disintegrating tablet Take 1 tablet (4 mg total) by mouth every 6 (six) hours as needed for nausea. 20 tablet 0  . pantoprazole (PROTONIX) 40 MG tablet Take 1 tablet (40 mg total) by mouth daily. 30 minutes before breakfast 30 tablet 3   No current facility-administered medications  for this visit.     Allergies as of 06/24/2019  . (No Known Allergies)    Family History  Problem Relation Age of Onset  . Colon cancer Neg Hx     Social History   Socioeconomic History  . Marital status: Single    Spouse name: Not on file  . Number of children: Not on file  . Years of education: Not on file  . Highest education level: Not on file  Occupational History  . Not on file  Social Needs  . Financial resource strain: Not on file  . Food insecurity    Worry: Not on file    Inability: Not on file  . Transportation needs    Medical: Not on file    Non-medical: Not on file  Tobacco Use  . Smoking status: Never Smoker  . Smokeless tobacco: Never Used  Substance and Sexual Activity  . Alcohol use: Not Currently  . Drug use: Never  . Sexual activity: Yes    Birth control/protection: Injection  Lifestyle  . Physical activity    Days per week: Not on file    Minutes per session: Not on file  . Stress: Not on file  Relationships  . Social Musician on phone: Not on file    Gets together: Not on file    Attends religious service: Not on file    Active member of club or organization: Not on file    Attends meetings of clubs or organizations: Not on file    Relationship status:  Not on file  Other Topics Concern  . Not on file  Social History Narrative  . Not on file    Review of Systems: Gen: Denies fever, chills, anorexia. Denies fatigue, weakness, weight loss.  CV: Denies chest pain, palpitations, syncope, peripheral edema, and claudication. Resp: Denies dyspnea at rest, cough, wheezing, coughing up blood, and pleurisy. GI: see HPI Derm: Denies rash, itching, dry skin Psych: Denies depression, anxiety, memory loss, confusion. No homicidal or suicidal ideation.  Heme: Denies bruising, bleeding, and enlarged lymph nodes.  Physical Exam: BP 126/88   Pulse 92   Temp (!) 96.9 F (36.1 C) (Oral)   Ht 5' (1.524 m)   Wt 168 lb 3.2 oz (76.3 kg)    BMI 32.85 kg/m  General:   Alert and oriented. No distress noted. Pleasant and cooperative.  Head:  Normocephalic and atraumatic. Eyes:  Conjuctiva clear without scleral icterus. Lungs: clear bilaterally Cardiac: S1 S2 present without murmurs Abdomen:  +BS, soft, mild upper abdominal tenderness and non-distended. No rebound or guarding. No HSM or masses noted. Msk:  Symmetrical without gross deformities. Normal posture. Extremities:  Without edema. Neurologic:  Alert and  oriented x4 Psych:  Alert and cooperative. Normal mood and affect.

## 2019-06-24 NOTE — Patient Instructions (Addendum)
I have sent Protonix to your pharmacy to take once each morning, 30 minutes before breakfast. Let me know how this works for you in the next 2 weeks.   Please have blood work done today.  We have scheduled you for a colonoscopy with Dr. Oneida Alar in the near future!  It was a pleasure to see you today. I want to create trusting relationships with patients to provide genuine, compassionate, and quality care. I value your feedback. If you receive a survey regarding your visit,  I greatly appreciate you taking time to fill this out.   Annitta Needs, PhD, ANP-BC [facility]     Food Choices for Gastroesophageal Reflux Disease, Adult When you have gastroesophageal reflux disease (GERD), the foods you eat and your eating habits are very important. Choosing the right foods can help ease the discomfort of GERD. Consider working with a diet and nutrition specialist (dietitian) to help you make healthy food choices. What general guidelines should I follow?  Eating plan  Choose healthy foods low in fat, such as fruits, vegetables, whole grains, low-fat dairy products, and lean meat, fish, and poultry.  Eat frequent, small meals instead of three large meals each day. Eat your meals slowly, in a relaxed setting. Avoid bending over or lying down until 2-3 hours after eating.  Limit high-fat foods such as fatty meats or fried foods.  Limit your intake of oils, butter, and shortening to less than 8 teaspoons each day.  Avoid the following: ? Foods that cause symptoms. These may be different for different people. Keep a food diary to keep track of foods that cause symptoms. ? Alcohol. ? Drinking large amounts of liquid with meals. ? Eating meals during the 2-3 hours before bed.  Cook foods using methods other than frying. This may include baking, grilling, or broiling. Lifestyle  Maintain a healthy weight. Ask your health care provider what weight is healthy for you. If you need to  lose weight, work with your health care provider to do so safely.  Exercise for at least 30 minutes on 5 or more days each week, or as told by your health care provider.  Avoid wearing clothes that fit tightly around your waist and chest.  Do not use any products that contain nicotine or tobacco, such as cigarettes and e-cigarettes. If you need help quitting, ask your health care provider.  Sleep with the head of your bed raised. Use a wedge under the mattress or blocks under the bed frame to raise the head of the bed. What foods are not recommended? The items listed may not be a complete list. Talk with your dietitian about what dietary choices are best for you. Grains Pastries or quick breads with added fat. [place] toast. Vegetables Deep fried vegetables. [place] fries. Any vegetables prepared with added fat. Any vegetables that cause symptoms. For some people this may include tomatoes and tomato products, chili peppers, onions and garlic, and horseradish. Fruits Any fruits prepared with added fat. Any fruits that cause symptoms. For some people this may include citrus fruits, such as oranges, grapefruit, pineapple, and lemons. Meats and other protein foods High-fat meats, such as fatty beef or pork, hot dogs, ribs, ham, sausage, salami and bacon. Fried meat or protein, including fried fish and fried chicken. Nuts and nut butters. Dairy Whole milk and chocolate milk. Sour cream. Cream. Ice cream. Cream cheese. Milk shakes. Beverages Coffee and tea, with or without caffeine. Carbonated beverages. Sodas. Energy drinks. Fruit juice made  with acidic fruits (such as orange or grapefruit). Tomato juice. Alcoholic drinks. Fats and oils Butter. Margarine. Shortening. Ghee. Sweets and desserts Chocolate and cocoa. Donuts. Seasoning and other foods Pepper. Peppermint and spearmint. Any condiments, herbs, or seasonings that cause symptoms. For some people, this may include curry, hot sauce, or  vinegar-based salad dressings. Summary  When you have gastroesophageal reflux disease (GERD), food and lifestyle choices are very important to help ease the discomfort of GERD.  Eat frequent, small meals instead of three large meals each day. Eat your meals slowly, in a relaxed setting. Avoid bending over or lying down until 2-3 hours after eating.  Limit high-fat foods such as fatty meat or fried foods. This information is not intended to replace advice given to you by your health care provider. Make sure you discuss any questions you have with your health care provider. Document Released: 09/09/2005 Document Revised: 12/31/2018 Document Reviewed: 09/10/2016 Elsevier Patient Education  2020 [employer].

## 2019-06-25 NOTE — Assessment & Plan Note (Signed)
33 year old female with LUQ/RUQ abdominal pain intermittently, unprovoked by eating, intermittent nausea, afebrile. Notes chronic GERD, taking Tums and no PPI. Partial colectomy in Aug 7116 due to complicated diverticulitis. Likely dealing with gastritis, uncontrolled GERD. Start Protonix once daily. Avoid NSAIDs. GERD diet discussed. Call if no improvement. Check CBC and CMP now.

## 2019-06-25 NOTE — Assessment & Plan Note (Signed)
Prior multiple episodes of diverticulitis, contained fluid collection, s/p lap sigmoid colectomy by Dr. Constance Haw in Aug 2020. No prior colonoscopy. Needs screening colonoscopy in near future. No concerning lower GI signs/symptoms.   Proceed with colonoscopy with Dr. Oneida Alar in the near future. The risks, benefits, and alternatives have been discussed in detail with the patient. They state understanding and desire to proceed.

## 2019-06-25 NOTE — Progress Notes (Signed)
CBC and CMP are normal. Continue with PPI and call with progress report.

## 2019-09-06 ENCOUNTER — Telehealth: Payer: Self-pay

## 2019-09-06 NOTE — Telephone Encounter (Signed)
Called pt. She has rescheduled procedure to next available 4/7 at 2:00pm. Patient aware will mail new instructions with new covid test to her. Informed endo of appt change.

## 2019-09-06 NOTE — Telephone Encounter (Signed)
Pt called to r/s her TCS scheduled for this Friday 09/10/2019. [phone]

## 2019-09-08 ENCOUNTER — Other Ambulatory Visit (HOSPITAL_COMMUNITY): Payer: Medicaid Other

## 2019-09-15 ENCOUNTER — Other Ambulatory Visit: Payer: Self-pay | Admitting: Gastroenterology

## 2019-12-27 ENCOUNTER — Other Ambulatory Visit: Payer: Self-pay

## 2019-12-27 ENCOUNTER — Other Ambulatory Visit (HOSPITAL_COMMUNITY)
Admission: RE | Admit: 2019-12-27 | Discharge: 2019-12-27 | Disposition: A | Discharge status: Home / Self Care | Payer: Medicaid Other | Source: Ambulatory Visit | Attending: Gastroenterology | Admitting: Gastroenterology

## 2019-12-28 ENCOUNTER — Telehealth: Payer: Self-pay | Admitting: Gastroenterology

## 2019-12-28 NOTE — Telephone Encounter (Signed)
Tried to call pt, no answer, LMOVM for return call.  

## 2019-12-28 NOTE — Telephone Encounter (Signed)
Melanie called from Short Stay to let us know patient was a no show for her covid test and she's on SF schedule for tomorrow.

## 2019-12-28 NOTE — Telephone Encounter (Signed)
Informed endo scheduler to cancel procedure for tomorrow.  FYI to AB.

## 2019-12-29 ENCOUNTER — Encounter (HOSPITAL_COMMUNITY): Admission: RE | Payer: Self-pay | Source: Home / Self Care

## 2019-12-29 ENCOUNTER — Ambulatory Visit (HOSPITAL_COMMUNITY): Admission: RE | Admit: 2019-12-29 | Payer: Medicaid Other | Source: Home / Self Care | Admitting: Gastroenterology

## 2019-12-29 SURGERY — COLONOSCOPY
Anesthesia: Moderate Sedation

## 2019-12-30 IMAGING — CT CT ABDOMEN AND PELVIS WITH CONTRAST
2 of 4 series · 16 of 46 positions shown, 18 images · IV contrast (Isovue)
Comparison: 02/18/2019

CLINICAL DATA: Follow up to abscess seen on January 2019 scan. Was
treated with antibiotics, No surgeries, Hx of diverticulitis with
multiple instances of abscess formation. htn Note. Pt refused oral
contrast.

EXAM:
CT ABDOMEN AND PELVIS WITH CONTRAST
TECHNIQUE: Multidetector CT imaging of the abdomen and pelvis was performed
using the standard protocol following bolus administration of
intravenous contrast.
CONTRAST:  100mL OMNIPAQUE IOHEXOL 300 MG/ML  SOLN

[Series 2: axial st · axial · 0.81mm/px · z∈[+1176,+1596]mm · 13 of 98 slices shown, 15 images]
[im 7/98  soft-tissue]
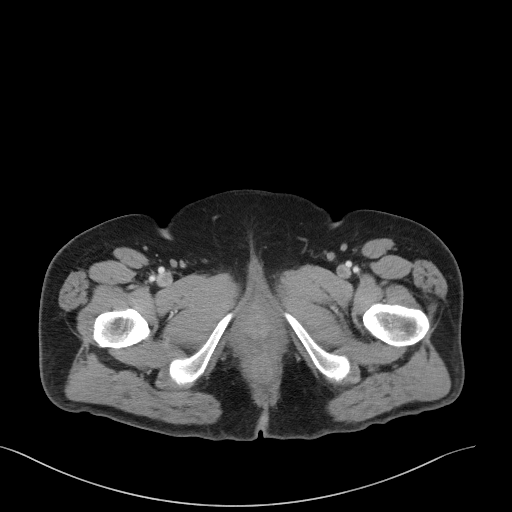
[im 7/98  bone]
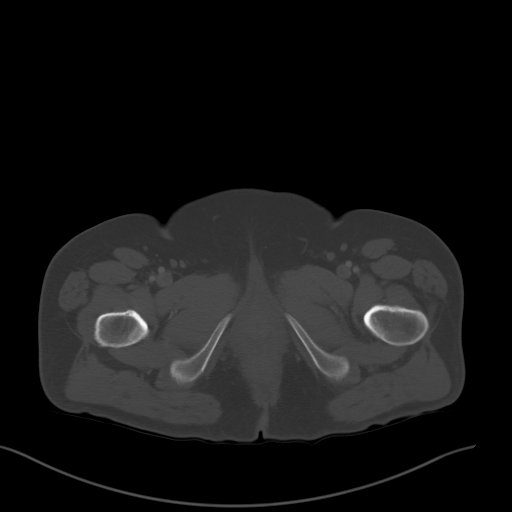
[im 13/98  soft-tissue]
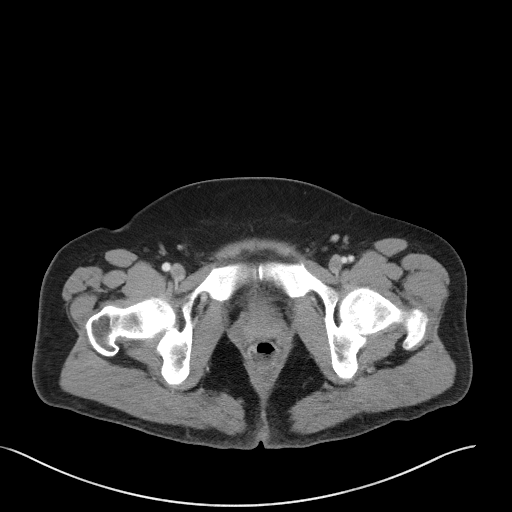
[im 19/98  soft-tissue]
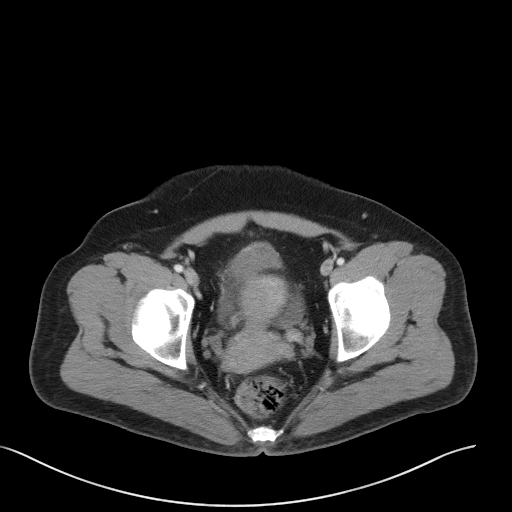
[im 31/98  soft-tissue]
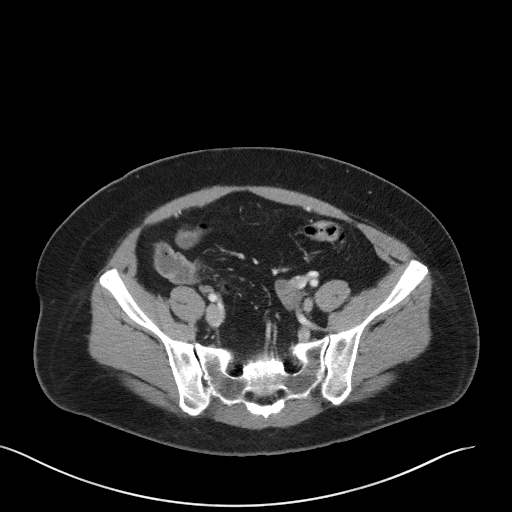
[im 37/98  soft-tissue]
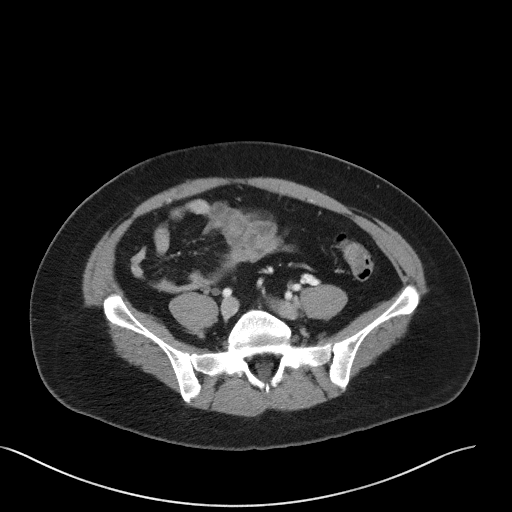
[im 43/98  soft-tissue]
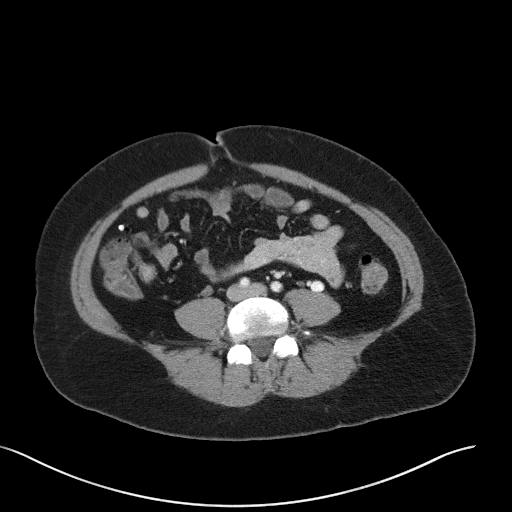
[im 49/98  soft-tissue]
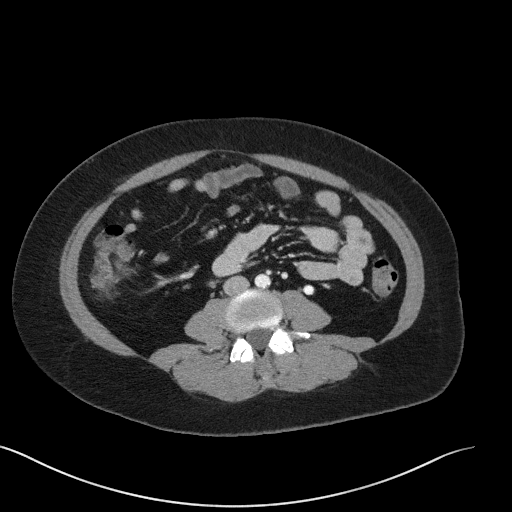
[im 55/98  soft-tissue]
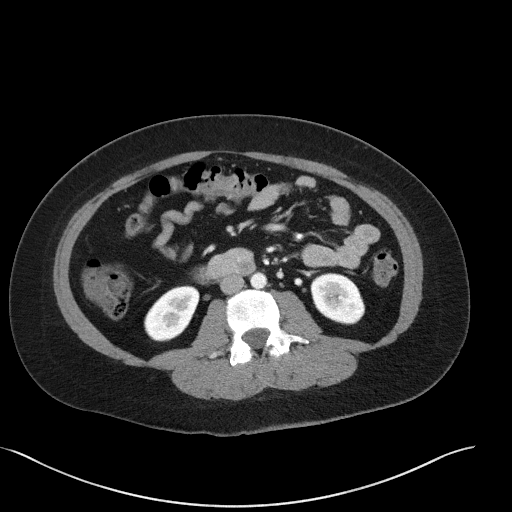
[im 61/98  soft-tissue]
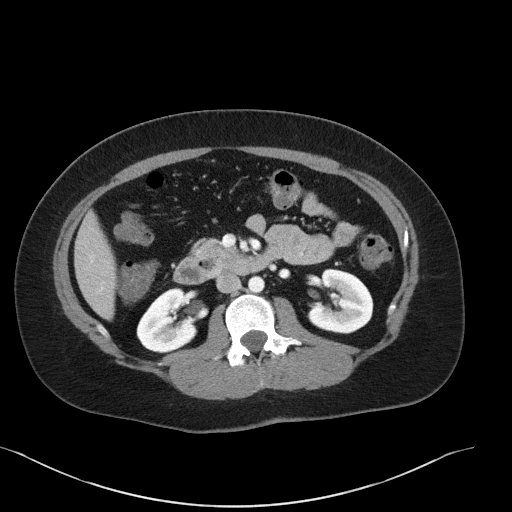
[im 61/98  bone]
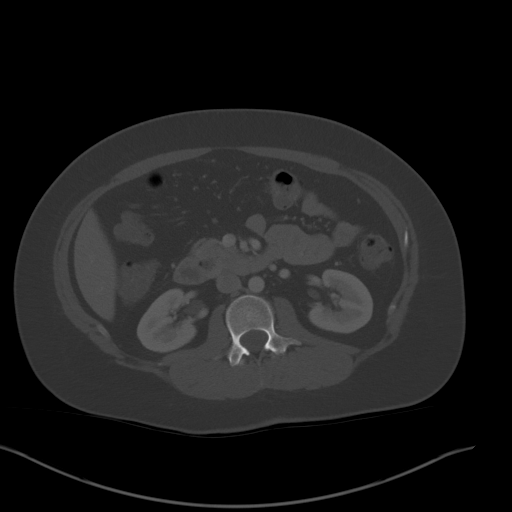
[im 67/98  soft-tissue]
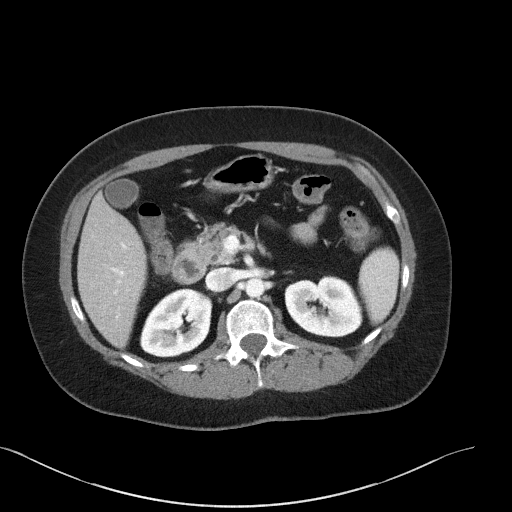
[im 79/98  soft-tissue]
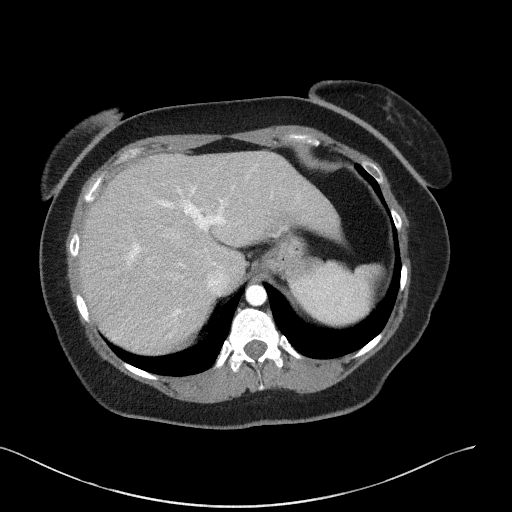
[im 85/98  soft-tissue]
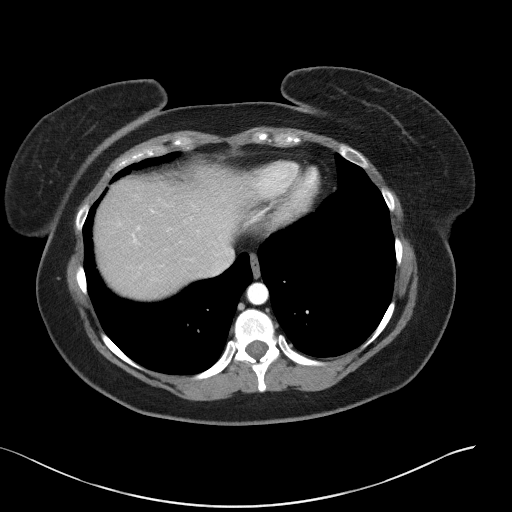
[im 91/98  soft-tissue]
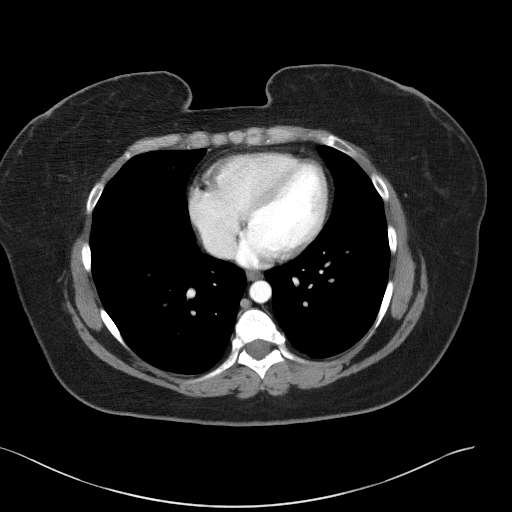

[Series 5: coronal st · coronal · 0.76mm/px · 3 of 102 slices shown]
[im 34/102  soft-tissue]
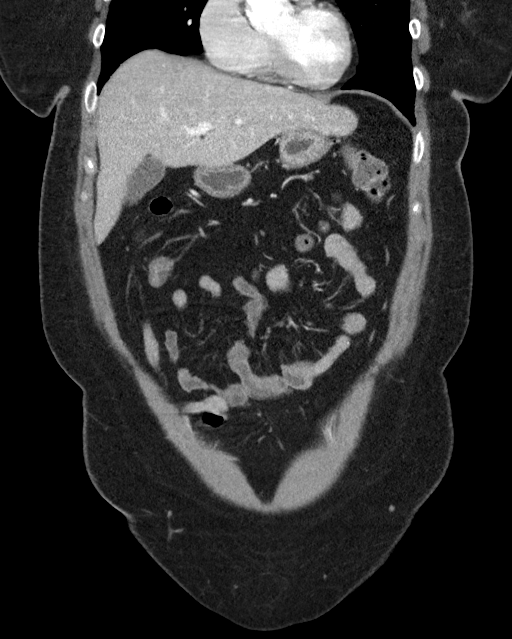
[im 45/102  soft-tissue]
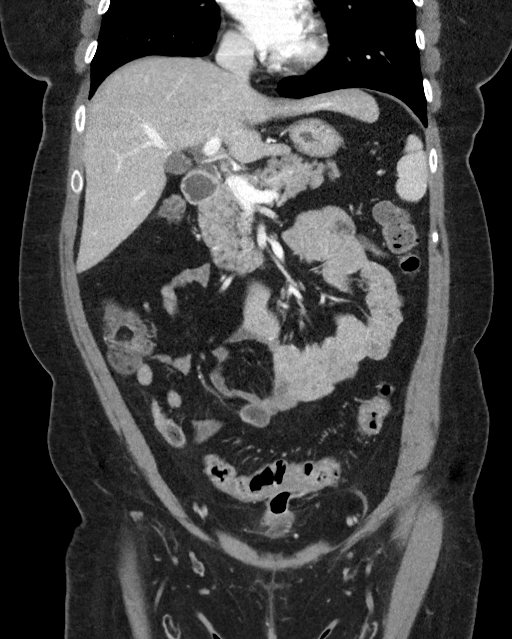
[im 57/102  soft-tissue]
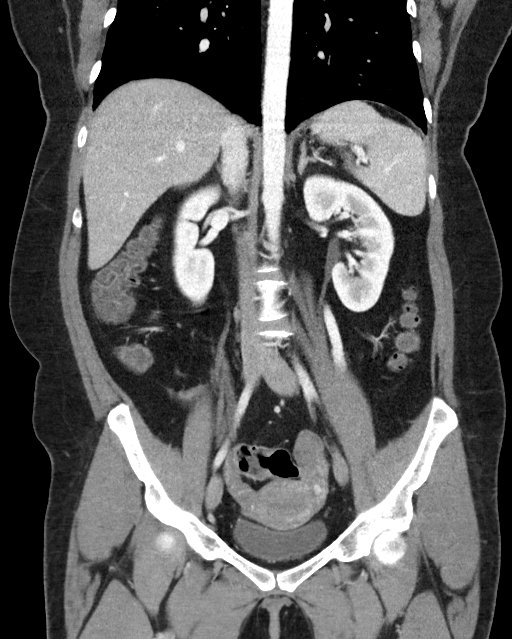

[16 of 46 positions shown; findings below may reference images not displayed]

FINDINGS: Lower chest: Lung bases are unremarkable.

Hepatobiliary: No focal liver abnormality is seen. No radiopaque
gallstones, biliary dilatation, or pericholecystic inflammatory
changes.

Pancreas: Unremarkable. No pancreatic ductal dilatation or
surrounding inflammatory changes.

Spleen: Normal in size without focal abnormality.

Adrenals/Urinary Tract: Adrenal glands are unremarkable. Kidneys are
normal, without renal calculi, focal lesion, or hydronephrosis.
There is thickening of the wall of the chest on a trauma contiguous
with small abscess, described below. No air is identified within the
bladder to indicate presence of colovesical fistula.

Stomach/Bowel: The stomach and small bowel loops are normal in
appearance. Normal appearance of the terminal ileum and appendix.

There are numerous colonic diverticula. There is persistent small
rim enhancing fluid collection inferior to the sigmoid colon and
superior and contiguous with the dome of the bladder. This measures
2.4 x 2.0 x 2.3 centimeters and continues on a small sinus tract
into the wall of the sigmoid. This collection appears unchanged
since the previous exam. No new abscess. No free intraperitoneal air

Vascular/Lymphatic: No significant vascular findings are present. No
enlarged abdominal or pelvic lymph nodes.

Reproductive: Uterus and ovaries are normal in appearance.

Other: Small fat containing paraumbilical hernia. No ascites.

Musculoskeletal: No acute or significant osseous findings.
IMPRESSION: 1. Stable appearance of small abscess adjacent to the sigmoid colon.
2. Given the proximity of the abscess with the dome of the bladder,
the patient is at risk of colovesical fistula, although none is
visualized identified at this time.
3. No new abscess or free intraperitoneal air.
4. Colonic diverticulosis.
5. Small fat containing paraumbilical hernia.

## 2019-12-31 ENCOUNTER — Other Ambulatory Visit: Payer: Self-pay | Admitting: Obstetrics and Gynecology

## 2019-12-31 DIAGNOSIS — O3680X0 Pregnancy with inconclusive fetal viability, not applicable or unspecified: Secondary | ICD-10-CM

## 2020-01-03 ENCOUNTER — Ambulatory Visit (INDEPENDENT_AMBULATORY_CARE_PROVIDER_SITE_OTHER): Payer: Medicaid Other

## 2020-01-03 ENCOUNTER — Other Ambulatory Visit: Payer: Self-pay

## 2020-01-03 ENCOUNTER — Other Ambulatory Visit: Payer: Self-pay | Admitting: Obstetrics and Gynecology

## 2020-01-03 DIAGNOSIS — Z363 Encounter for antenatal screening for malformations: Secondary | ICD-10-CM

## 2020-01-03 DIAGNOSIS — O3680X0 Pregnancy with inconclusive fetal viability, not applicable or unspecified: Secondary | ICD-10-CM

## 2020-01-03 DIAGNOSIS — Z3A21 21 weeks gestation of pregnancy: Secondary | ICD-10-CM | POA: Diagnosis not present

## 2020-01-03 NOTE — Progress Notes (Signed)
[place] 21+1 wks,cephalic,anterior placenta gr 0,normal ovaries,fhr 166 bpm,svp 4.4 cm,EFW 423 g,anatomy complete,no obvious abnormalities, EDD 05/14/2020 by today's ultrasound

## 2020-01-05 ENCOUNTER — Ambulatory Visit (INDEPENDENT_AMBULATORY_CARE_PROVIDER_SITE_OTHER): Payer: Medicaid Other | Admitting: Women's Health

## 2020-01-05 ENCOUNTER — Other Ambulatory Visit (HOSPITAL_COMMUNITY)
Admission: RE | Admit: 2020-01-05 | Discharge: 2020-01-05 | Disposition: A | Discharge status: Home / Self Care | Payer: Medicaid Other | Source: Ambulatory Visit | Attending: Obstetrics & Gynecology | Admitting: Obstetrics & Gynecology

## 2020-01-05 ENCOUNTER — Ambulatory Visit: Payer: Medicaid Other | Admitting: *Deleted

## 2020-01-05 ENCOUNTER — Other Ambulatory Visit: Payer: Self-pay

## 2020-01-05 ENCOUNTER — Encounter: Payer: Self-pay | Admitting: Women's Health

## 2020-01-05 VITALS — BP 124/98 | HR 104 | Wt 162.0 lb

## 2020-01-05 DIAGNOSIS — O10912 Unspecified pre-existing hypertension complicating pregnancy, second trimester: Secondary | ICD-10-CM

## 2020-01-05 DIAGNOSIS — O0992 Supervision of high risk pregnancy, unspecified, second trimester: Secondary | ICD-10-CM

## 2020-01-05 DIAGNOSIS — Z3A21 21 weeks gestation of pregnancy: Secondary | ICD-10-CM | POA: Diagnosis not present

## 2020-01-05 DIAGNOSIS — O0932 Supervision of pregnancy with insufficient antenatal care, second trimester: Secondary | ICD-10-CM | POA: Diagnosis not present

## 2020-01-05 DIAGNOSIS — O0990 Supervision of high risk pregnancy, unspecified, unspecified trimester: Secondary | ICD-10-CM | POA: Insufficient documentation

## 2020-01-05 DIAGNOSIS — I10 Essential (primary) hypertension: Secondary | ICD-10-CM | POA: Insufficient documentation

## 2020-01-05 LAB — POCT URINALYSIS DIPSTICK OB
Ketones, UA: NEGATIVE
Leukocytes, UA: NEGATIVE
Nitrite, UA: NEGATIVE
POC,PROTEIN,UA: NEGATIVE

## 2020-01-05 MED ORDER — BLOOD PRESSURE MONITOR MISC: 0 refills | Status: AC

## 2020-01-05 NOTE — Progress Notes (Addendum)
INITIAL OBSTETRICAL VISIT Patient name: Megan West MRN [id number]  Date of birth: June 30, 1986 Chief Complaint:   Initial Prenatal Visit  History of Present Illness:   Megan West is a 34 y.o. [id number] Caucasian female at [redacted]w[redacted]d by 21wk u/s, with an Estimated Date of Delivery: 05/14/20 being seen today for her initial obstetrical visit.  Didn't know she was pregnant until last week.  Her obstetrical history is significant for EAB x 1, term uncomplicated SVB x 2- both at [facility].   CHTN x 38yr, on metoprolol, PCP just switched her to different pill last week when they found out she was pregnant, doesn't know name, will send [place] mychart message. Today she reports no complaints.  Depression screen PHQ 2/9 01/05/2020  Decreased Interest 1  Down, Depressed, Hopeless 0  PHQ - 2 Score 1  Altered sleeping 0  Tired, decreased energy 0  Change in appetite 0  Feeling bad or failure about yourself  0  Trouble concentrating 0  Moving slowly or fidgety/restless 0  Suicidal thoughts 0  PHQ-9 Score 1  Difficult doing work/chores Not difficult at all    Patient's last menstrual period was 10/27/2019. Last pap >31yrs ago. Results were: normal Review of Systems:   Pertinent items are noted in HPI Denies cramping/contractions, leakage of fluid, vaginal bleeding, abnormal vaginal discharge w/ itching/odor/irritation, headaches, visual changes, shortness of breath, chest pain, abdominal pain, severe nausea/vomiting, or problems with urination or bowel movements unless otherwise stated above.  Pertinent History Reviewed:  Reviewed past medical,surgical, social, obstetrical and family history.  Reviewed problem list, medications and allergies. OB History  Gravida Para Term Preterm AB Living  4 2 2   1 2   SAB TAB Ectopic Multiple Live Births    1     2    # Outcome Date GA Lbr Len/2nd Weight Sex Delivery Anes PTL Lv  4 Current           3 Term 12/21/06 [redacted]w[redacted]d  7 lb 11 oz (3.487 kg) F Vag-Spont  EPI N LIV  2 TAB 10/2003          1 Term 09/25/03 [redacted]w[redacted]d  8 lb 1 oz (3.657 kg) M Vag-Spont None N LIV   Physical Assessment:   Vitals:   01/05/20 0947  BP: (!) 124/98  Pulse: (!) 104  Weight: 162 lb (73.5 kg)  Body mass index is 31.64 kg/m.       Physical Examination:  General appearance - well appearing, and in no distress  Mental status - alert, oriented to person, place, and time  Psych:  She has a normal mood and affect  Skin - warm and dry, normal color, no suspicious lesions noted  Chest - effort normal, all lung fields clear to auscultation bilaterally  Heart - normal rate and regular rhythm  Abdomen - soft, nontender  Extremities:  No swelling or varicosities noted  Pelvic - VULVA: normal appearing vulva with no masses, tenderness or lesions  VAGINA: normal appearing vagina with normal color and discharge, no lesions  CERVIX: normal appearing cervix without discharge or lesions, no CMT  Thin prep pap is done w/ HR HPV cotesting  TODAY'S FHT 160 via doppler  Results for orders placed or performed in visit on 01/05/20 (from the past 24 hour(s))  POC Urinalysis Dipstick OB   Collection Time: 01/05/20 10:08 AM  Result Value Ref Range   Color, UA     Clarity, UA     Glucose, UA  Trace (A) Negative   Bilirubin, UA     Ketones, UA neg    Spec Grav, UA     Blood, UA trace    pH, UA     POC,PROTEIN,UA Negative Negative, Trace, Small (1+), Moderate (2+), Large (3+), 4+   Urobilinogen, UA     Nitrite, UA neg    Leukocytes, UA Negative Negative   Appearance     Odor      Assessment & Plan:  1) High-Risk Pregnancy [id number] at [redacted]w[redacted]d with an Estimated Date of Delivery: 05/14/20   2) Initial OB visit  3) Late pnc  4) CHTN> on metoprolol until last week PCP switched meds, doesn't know name- will send [place] mychart message. Just took today after her bp was taken. Will get baseline labs. Start ASA 162mg  daily  Meds:  Meds ordered this encounter  Medications  . Blood Pressure  Monitor MISC    Sig: For regular home bp monitoring during pregnancy    Dispense:  1 each    Refill:  0    O09.90    Initial labs obtained Continue prenatal vitamins Reviewed n/v relief measures and warning s/s to report Reviewed recommended weight gain based on pre-gravid BMI Encouraged well-balanced diet Genetic & carrier screening discussed: requested AFP, declined rest Ultrasound discussed; fetal survey: results reviewed CCNC completed> form faxed if has or is planning to apply for medicaid The nature of [facility] for with multiple MDs and other Advanced Practice Providers was explained to patient; also emphasized that fellows, residents, and students are part of our team. Does not have home bp cuff. Rx faxed to [facility]. Check bp weekly, let [employer] know if >140/90.   Indications for ASA therapy (per uptodate) One of the following: CHTN Yes  Follow-up: Return in about 3 weeks (around 01/26/2020) for HROB, CNM, in person.   Orders Placed This Encounter  Procedures  . Urine Culture  . GC/Chlamydia Probe Amp  . Pain Management Screening Profile (10S)  . Hepatitis C antibody  . Obstetric Panel, Including HIV  . Hgb Fractionation Cascade  . Comprehensive metabolic panel  . AFP TETRA  . Protein, urine, 24 hour  . POC Urinalysis Dipstick OB    03/27/2020 CNM, [facility] 01/05/2020 10:48 AM

## 2020-01-05 NOTE — Patient Instructions (Addendum)
[name], I greatly value your feedback.  If you receive a survey following your visit with [place] today, we appreciate you taking the time to fill it out.  Thanks, Joellyn Haff, CNM, WHNP-BC  [facility] ([address] [place], [place] [address]) Entrance C, located off of [address] valet parking   Begin taking 162mg  (two 81mg  tablets) baby aspirin daily to decrease the risk of preeclampsia during pregnancy    You can try a 7 night over the counter yeast cream such as Monistat 7. It does not need to be brand name, generic is fine, but please make sure it is a 7 night cream.    Go to [email or web address] to register for FREE online childbirth classes  [place] Pediatricians/Family Doctors:  Pediatrics [phone]            [facility] [phone]                 [facility] [phone] (usually not accepting new patients unless you have family there already, you are always welcome to call and ask)       [facility] Department [phone]       [facility] Pediatricians/Family Doctors:   [facility]: [phone]  [facility]: [phone]  [facility]: [phone]  [facility] Doctors:   [facility]: [phone]   [facility]: [phone]  [facility] Doctors:  [phone] Health Center: [phone]    Home Blood Pressure Monitoring for Patients   Your provider has recommended that you check your blood pressure (BP) at least once a week at home. If you do not have a blood pressure cuff at home, one will be provided for you. Contact your provider if you have not received your monitor within 1 week.   Helpful Tips for Accurate Home Blood Pressure Checks  . Don't smoke, exercise, or drink caffeine 30 minutes before checking your BP . Use the restroom before checking your BP (a full  bladder can raise your pressure) . Relax in a comfortable upright chair . Feet on the ground . Left arm resting comfortably on a flat surface at the level of your heart . Legs uncrossed . Back supported . Sit quietly and don't talk . Place the cuff on your bare arm . Adjust snuggly, so that only two fingertips can fit between your skin and the top of the cuff . Check 2 readings separated by at least one minute . Keep a log of your BP readings . For a visual, please reference this diagram: http://ccnc.care/bpdiagram  Provider Name: [facility]     Phone: [phone]  Zone 1: ALL CLEAR  Continue to monitor your symptoms:  . BP reading is less than 140 (top number) or less than 90 (bottom number)  . No right upper stomach pain . No headaches or seeing spots . No feeling nauseated or throwing up . No swelling in face and hands  Zone 2: CAUTION Call your doctor's office for any of the following:  . BP reading is greater than 140 (top number) or greater than 90 (bottom number)  . Stomach pain under your ribs in the middle or right side . Headaches or seeing spots . Feeling nauseated or throwing up . Swelling in face and hands  Zone 3: EMERGENCY  Seek immediate medical care if you have any of the following:  . BP reading is greater than160 (top  number) or greater than 110 (bottom number) . Severe headaches not improving with Tylenol . Serious difficulty catching your breath . Any worsening symptoms from Zone 2     Second Trimester of Pregnancy The second trimester is from week 14 through week 27 (months 4 through 6). The second trimester is often a time when you feel your best. Your body has adjusted to being pregnant, and you begin to feel better physically. Usually, morning sickness has lessened or quit completely, you may have more energy, and you may have an increase in appetite. The second trimester is also a time when the fetus is growing rapidly. At the end of the  sixth month, the fetus is about 9 inches long and weighs about 1 pounds. You will likely begin to feel the baby move (quickening) between 16 and 20 weeks of pregnancy. Body changes during your second trimester Your body continues to go through many changes during your second trimester. The changes vary from woman to woman.  Your weight will continue to increase. You will notice your lower abdomen bulging out.  You may begin to get stretch marks on your hips, abdomen, and breasts.  You may develop headaches that can be relieved by medicines. The medicines should be approved by your health care provider.  You may urinate more often because the fetus is pressing on your bladder.  You may develop or continue to have heartburn as a result of your pregnancy.  You may develop constipation because certain hormones are causing the muscles that push waste through your intestines to slow down.  You may develop hemorrhoids or swollen, bulging veins (varicose veins).  You may have back pain. This is caused by: ? Weight gain. ? Pregnancy hormones that are relaxing the joints in your pelvis. ? A shift in weight and the muscles that support your balance.  Your breasts will continue to grow and they will continue to become tender.  Your gums may bleed and may be sensitive to brushing and flossing.  Dark spots or blotches (chloasma, mask of pregnancy) may develop on your face. This will likely fade after the baby is born.  A dark line from your belly button to the pubic area (linea nigra) may appear. This will likely fade after the baby is born.  You may have changes in your hair. These can include thickening of your hair, rapid growth, and changes in texture. Some women also have hair loss during or after pregnancy, or hair that feels dry or thin. Your hair will most likely return to normal after your baby is born.  What to expect at prenatal visits During a routine prenatal visit:  You will be  weighed to make sure you and the fetus are growing normally.  Your blood pressure will be taken.  Your abdomen will be measured to track your baby's growth.  The fetal heartbeat will be listened to.  Any test results from the previous visit will be discussed.  Your health care provider may ask you:  How you are feeling.  If you are feeling the baby move.  If you have had any abnormal symptoms, such as leaking fluid, bleeding, severe headaches, or abdominal cramping.  If you are using any tobacco products, including cigarettes, chewing tobacco, and electronic cigarettes.  If you have any questions.  Other tests that may be performed during your second trimester include:  Blood tests that check for: ? Low iron levels (anemia). ? High blood sugar that affects pregnant women (  gestational diabetes) between 27 and 28 weeks. ? Rh antibodies. This is to check for a protein on red blood cells (Rh factor).  Urine tests to check for infections, diabetes, or protein in the urine.  An ultrasound to confirm the proper growth and development of the baby.  An amniocentesis to check for possible genetic problems.  Fetal screens for spina bifida and Down syndrome.  HIV (human immunodeficiency virus) testing. Routine prenatal testing includes screening for HIV, unless you choose not to have this test.  Follow these instructions at home: Medicines  Follow your health care provider's instructions regarding medicine use. Specific medicines may be either safe or unsafe to take during pregnancy.  Take a prenatal vitamin that contains at least 600 micrograms (mcg) of folic acid.  If you develop constipation, try taking a stool softener if your health care provider approves. Eating and drinking  Eat a balanced diet that includes fresh fruits and vegetables, whole grains, good sources of protein such as meat, eggs, or tofu, and low-fat dairy. Your health care provider will help you determine  the amount of weight gain that is right for you.  Avoid raw meat and uncooked cheese. These carry germs that can cause birth defects in the baby.  If you have low calcium intake from food, talk to your health care provider about whether you should take a daily calcium supplement.  Limit foods that are high in fat and processed sugars, such as fried and sweet foods.  To prevent constipation: ? Drink enough fluid to keep your urine clear or pale yellow. ? Eat foods that are high in fiber, such as fresh fruits and vegetables, whole grains, and beans. Activity  Exercise only as directed by your health care provider. Most women can continue their usual exercise routine during pregnancy. Try to exercise for 30 minutes at least 5 days a week. Stop exercising if you experience uterine contractions.  Avoid heavy lifting, wear low heel shoes, and practice good posture.  A sexual relationship may be continued unless your health care provider directs you otherwise. Relieving pain and discomfort  Wear a good support bra to prevent discomfort from breast tenderness.  Take warm sitz baths to soothe any pain or discomfort caused by hemorrhoids. Use hemorrhoid cream if your health care provider approves.  Rest with your legs elevated if you have leg cramps or low back pain.  If you develop varicose veins, wear support hose. Elevate your feet for 15 minutes, 3-4 times a day. Limit salt in your diet. Prenatal Care  Write down your questions. Take them to your prenatal visits.  Keep all your prenatal visits as told by your health care provider. This is important. Safety  Wear your seat belt at all times when driving.  Make a list of emergency phone numbers, including numbers for family, friends, the hospital, and police and fire departments. General instructions  Ask your health care provider for a referral to a local prenatal education class. Begin classes no later than the beginning of month 6  of your pregnancy.  Ask for help if you have counseling or nutritional needs during pregnancy. Your health care provider can offer advice or refer you to specialists for help with various needs.  Do not use hot tubs, steam rooms, or saunas.  Do not douche or use tampons or scented sanitary pads.  Do not cross your legs for long periods of time.  Avoid cat litter boxes and soil used by cats. These carry germs  that can cause birth defects in the baby and possibly loss of the fetus by miscarriage or stillbirth.  Avoid all smoking, herbs, alcohol, and unprescribed drugs. Chemicals in these products can affect the formation and growth of the baby.  Do not use any products that contain nicotine or tobacco, such as cigarettes and e-cigarettes. If you need help quitting, ask your health care provider.  Visit your dentist if you have not gone yet during your pregnancy. Use a soft toothbrush to brush your teeth and be gentle when you floss. Contact a health care provider if:  You have dizziness.  You have mild pelvic cramps, pelvic pressure, or nagging pain in the abdominal area.  You have persistent nausea, vomiting, or diarrhea.  You have a bad smelling vaginal discharge.  You have pain when you urinate. Get help right away if:  You have a fever.  You are leaking fluid from your vagina.  You have spotting or bleeding from your vagina.  You have severe abdominal cramping or pain.  You have rapid weight gain or weight loss.  You have shortness of breath with chest pain.  You notice sudden or extreme swelling of your face, hands, ankles, feet, or legs.  You have not felt your baby move in over an hour.  You have severe headaches that do not go away when you take medicine.  You have vision changes. Summary  The second trimester is from week 14 through week 27 (months 4 through 6). It is also a time when the fetus is growing rapidly.  Your body goes through many changes during  pregnancy. The changes vary from woman to woman.  Avoid all smoking, herbs, alcohol, and unprescribed drugs. These chemicals affect the formation and growth your baby.  Do not use any tobacco products, such as cigarettes, chewing tobacco, and e-cigarettes. If you need help quitting, ask your health care provider.  Contact your health care provider if you have any questions. Keep all prenatal visits as told by your health care provider. This is important. This information is not intended to replace advice given to you by your health care provider. Make sure you discuss any questions you have with your health care provider. Document Released: 09/03/2001 Document Revised: 02/15/2016 Document Reviewed: 11/10/2012 Elsevier Interactive Patient Education  2017 [place] FLU! Because you are pregnant, we at [facility], along with the Centers for Disease Control (CDC), recommend that you receive the flu vaccine to protect yourself and your baby from the flu. The flu is more likely to cause severe illness in pregnant women than in women of reproductive age who are not pregnant. Changes in the immune system, heart, and lungs during pregnancy make pregnant women (and women up to two weeks postpartum) more prone to severe illness from flu, including illness resulting in hospitalization. Flu also may be harmful for a pregnant woman's developing baby. A common flu symptom is fever, which may be associated with neural tube defects and other adverse outcomes for a developing baby. Getting vaccinated can also help protect a baby after birth from flu. (Mom passes antibodies onto the developing baby during her pregnancy.)  A Flu Vaccine is the Best Protection Against Flu Getting a flu vaccine is the first and most important step in protecting against flu. Pregnant women should get a flu shot and not the live attenuated influenza vaccine (LAIV), also known as nasal spray flu  vaccine. Flu vaccines given during pregnancy  help protect both the mother and her baby from flu. Vaccination has been shown to reduce the risk of flu-associated acute respiratory infection in pregnant women by up to one-half. A 2018 study showed that getting a flu shot reduced a pregnant woman's risk of being hospitalized with flu by an average of 40 percent. Pregnant women who get a flu vaccine are also helping to protect their babies from flu illness for the first several months after their birth, when they are too young to get vaccinated.   A Long Record of Safety for Flu Shots in Pregnant Women Flu shots have been given to millions of pregnant women over many years with a good safety record. There is a lot of evidence that flu vaccines can be given safely during pregnancy; though these data are limited for the first trimester. The CDC recommends that pregnant women get vaccinated during any trimester of their pregnancy. It is very important for pregnant women to get the flu shot.   Other Preventive Actions In addition to getting a flu shot, pregnant women should take the same everyday preventive actions the CDC recommends of everyone, including covering coughs, washing hands often, and avoiding people who are sick.  Symptoms and Treatment If you get sick with flu symptoms call your doctor right away. There are antiviral drugs that can treat flu illness and prevent serious flu complications. The CDC recommends prompt treatment for people who have influenza infection or suspected influenza infection and who are at high risk of serious flu complications, such as people with asthma, diabetes (including gestational diabetes), or heart disease. Early treatment of influenza in hospitalized pregnant women has been shown to reduce the length of the hospital stay.  Symptoms Flu symptoms include fever, cough, sore throat, runny or stuffy nose, body aches, headache, chills and fatigue. Some people may also have  vomiting and diarrhea. People may be infected with the flu and have respiratory symptoms without a fever.  Early Treatment is Important for Pregnant Women Treatment should begin as soon as possible because antiviral drugs work best when started early (within 48 hours after symptoms start). Antiviral drugs can make your flu illness milder and make you feel better faster. They may also prevent serious health problems that can result from flu illness. Oral oseltamivir (Tamiflu) is the preferred treatment for pregnant women because it has the most studies available to suggest that it is safe and beneficial. Antiviral drugs require a prescription from your provider. Having a fever caused by flu infection or other infections early in pregnancy may be linked to birth defects in a baby. In addition to taking antiviral drugs, pregnant women who get a fever should treat their fever with Tylenol (acetaminophen) and contact their provider immediately.  When to Seek Emergency Medical Care If you are pregnant and have any of these signs, seek care immediately:  Difficulty breathing or shortness of breath  Pain or pressure in the chest or abdomen  Sudden dizziness  Confusion  Severe or persistent vomiting  High fever that is not responding to Tylenol (or store brand equivalent)  Decreased or no movement of your baby  [email or web address]

## 2020-01-06 LAB — GC/CHLAMYDIA PROBE AMP
Chlamydia trachomatis, NAA: NEGATIVE
Neisseria Gonorrhoeae by PCR: NEGATIVE

## 2020-01-07 LAB — OBSTETRIC PANEL, INCLUDING HIV
Antibody Screen: NEGATIVE
Basophils Absolute: 0 10*3/uL (ref 0.0–0.2)
Basos: 0 %
EOS (ABSOLUTE): 0 10*3/uL (ref 0.0–0.4)
Eos: 0 %
HIV Screen 4th Generation wRfx: NONREACTIVE
Hematocrit: 35 % (ref 34.0–46.6)
Hemoglobin: 11.9 g/dL (ref 11.1–15.9)
Hepatitis B Surface Ag: NEGATIVE
Immature Grans (Abs): 0.2 10*3/uL — ABNORMAL HIGH (ref 0.0–0.1)
Immature Granulocytes: 1 %
Lymphocytes Absolute: 2.1 10*3/uL (ref 0.7–3.1)
Lymphs: 19 %
MCH: 31.5 pg (ref 26.6–33.0)
MCHC: 34 g/dL (ref 31.5–35.7)
MCV: 93 fL (ref 79–97)
Monocytes Absolute: 0.6 10*3/uL (ref 0.1–0.9)
Monocytes: 6 %
Neutrophils Absolute: 8.3 10*3/uL — ABNORMAL HIGH (ref 1.4–7.0)
Neutrophils: 74 %
Platelets: 288 10*3/uL (ref 150–450)
RBC: 3.78 x10E6/uL (ref 3.77–5.28)
RDW: 13.1 % (ref 11.7–15.4)
RPR Ser Ql: NONREACTIVE
Rh Factor: POSITIVE
Rubella Antibodies, IGG: 2.41 index (ref >0.99)
WBC: 11.2 10*3/uL — ABNORMAL HIGH (ref 3.4–10.8)

## 2020-01-07 LAB — AFP TETRA
DIA Mom Value: 0.95
DIA Value (EIA): 198.14 pg/mL
DSR (By Age)    1 IN: 292
DSR (Second Trimester) 1 IN: 3229
Gestational Age: 21.3 WEEKS
MSAFP Mom: 0.99
MSAFP: 65 ng/mL
MSHCG Mom: 0.64
MSHCG: 14096 m[IU]/mL
Maternal Age At EDD: 35.1 yr
Osb Risk: 10000
T18 (By Age): 1:1137 {titer}
Test Results:: NEGATIVE
Weight: 162 [lb_av]
uE3 Mom: 0.95
uE3 Value: 2.73 ng/mL

## 2020-01-07 LAB — COMPREHENSIVE METABOLIC PANEL
ALT: 17 IU/L (ref 0–32)
AST: 13 IU/L (ref 0–40)
Albumin/Globulin Ratio: 1.5 (ref 1.2–2.2)
Albumin: 3.8 g/dL (ref 3.8–4.8)
Alkaline Phosphatase: 57 IU/L (ref 39–117)
BUN/Creatinine Ratio: 13 (ref 9–23)
BUN: 5 mg/dL — ABNORMAL LOW (ref 6–20)
Bilirubin Total: <0.2 mg/dL (ref 0.0–1.2)
CO2: 20 mmol/L (ref 20–29)
Calcium: 9.6 mg/dL (ref 8.7–10.2)
Chloride: 104 mmol/L (ref 96–106)
Creatinine, Ser: 0.4 mg/dL — ABNORMAL LOW (ref 0.57–1.00)
GFR calc Af Amer: 157 mL/min/{1.73_m2} (ref >59)
GFR calc non Af Amer: 136 mL/min/{1.73_m2} (ref >59)
Globulin, Total: 2.6 g/dL (ref 1.5–4.5)
Glucose: 91 mg/dL (ref 65–99)
Potassium: 4.2 mmol/L (ref 3.5–5.2)
Sodium: 137 mmol/L (ref 134–144)
Total Protein: 6.4 g/dL (ref 6.0–8.5)

## 2020-01-07 LAB — CYTOLOGY - PAP
Comment: NEGATIVE
Diagnosis: NEGATIVE
Diagnosis: REACTIVE
High risk HPV: NEGATIVE

## 2020-01-07 LAB — HGB FRACTIONATION CASCADE
Hgb A2: 2.6 % (ref 1.8–3.2)
Hgb A: 97.4 % (ref 96.4–98.8)
Hgb F: 0 % (ref 0.0–2.0)
Hgb S: 0 %

## 2020-01-07 LAB — PMP SCREEN PROFILE (10S), URINE
Amphetamine Scrn, Ur: NEGATIVE ng/mL
BARBITURATE SCREEN URINE: NEGATIVE ng/mL
BENZODIAZEPINE SCREEN, URINE: NEGATIVE ng/mL
CANNABINOIDS UR QL SCN: NEGATIVE ng/mL
Cocaine (Metab) Scrn, Ur: NEGATIVE ng/mL
Creatinine(Crt), U: 36.4 mg/dL (ref 20.0–300.0)
Methadone Screen, Urine: NEGATIVE ng/mL
OXYCODONE+OXYMORPHONE UR QL SCN: NEGATIVE ng/mL
Opiate Scrn, Ur: NEGATIVE ng/mL
Ph of Urine: 5.8 (ref 4.5–8.9)
Phencyclidine Qn, Ur: NEGATIVE ng/mL
Propoxyphene Scrn, Ur: NEGATIVE ng/mL

## 2020-01-07 LAB — HEPATITIS C ANTIBODY: Hep C Virus Ab: <0.1 s/co ratio (ref 0.0–0.9)

## 2020-01-10 LAB — URINE CULTURE

## 2020-01-18 ENCOUNTER — Encounter: Payer: Medicaid Other | Admitting: Women's Health

## 2020-01-26 ENCOUNTER — Encounter: Payer: Medicaid Other | Admitting: Advanced Practice Midwife

## 2020-01-31 ENCOUNTER — Ambulatory Visit (INDEPENDENT_AMBULATORY_CARE_PROVIDER_SITE_OTHER): Payer: Medicaid Other | Admitting: Obstetrics and Gynecology

## 2020-01-31 ENCOUNTER — Other Ambulatory Visit: Payer: Self-pay

## 2020-01-31 VITALS — BP 119/83 | HR 90 | Wt 171.4 lb

## 2020-01-31 DIAGNOSIS — Z3A25 25 weeks gestation of pregnancy: Secondary | ICD-10-CM

## 2020-01-31 DIAGNOSIS — Z331 Pregnant state, incidental: Secondary | ICD-10-CM

## 2020-01-31 DIAGNOSIS — Z1389 Encounter for screening for other disorder: Secondary | ICD-10-CM

## 2020-01-31 DIAGNOSIS — O10912 Unspecified pre-existing hypertension complicating pregnancy, second trimester: Secondary | ICD-10-CM

## 2020-01-31 LAB — POCT URINALYSIS DIPSTICK OB
Blood, UA: NEGATIVE
Glucose, UA: NEGATIVE
Ketones, UA: NEGATIVE
Leukocytes, UA: NEGATIVE
Nitrite, UA: NEGATIVE
POC,PROTEIN,UA: NEGATIVE

## 2020-01-31 NOTE — Progress Notes (Signed)
PATIENT ID: Megan West, female     DOB: 10/11/1985, 34 y.o.     MRN: [id number]     [facility] PREGNANCY VISIT PATIENT NAME: Megan West MRN [id number]  DOB: 04-30-1986 Chief Complaint:   Routine Prenatal Visit  History of Present Illness:   Megan West is a 34 y.o. [id number] female at [redacted]w[redacted]d with an Estimated Date of Delivery: 05/14/20 being seen today for ongoing management of a high-risk pregnancy complicated by chronic hypertension on no antihypertensive meds, currently on aspirin 81mg .  Today she reports no complaints.   Depression screen PHQ 2/9 01/05/2020  Decreased Interest 1  Down, Depressed, Hopeless 0  PHQ - 2 Score 1  Altered sleeping 0  Tired, decreased energy 0  Change in appetite 0  Feeling bad or failure about yourself  0  Trouble concentrating 0  Moving slowly or fidgety/restless 0  Suicidal thoughts 0  PHQ-9 Score 1  Difficult doing work/chores Not difficult at all   Contractions: Not present. Vag. Bleeding: None.  Movement: Present. denies leaking of fluid.   Review of Systems:   Pertinent items are noted in HPI Denies abnormal vaginal discharge w/ itching/odor/irritation, headaches, visual changes, shortness of breath, chest pain, abdominal pain, severe nausea/vomiting, or problems with urination or bowel movements unless otherwise stated above.  Pertinent History Reviewed:  Reviewed past medical,surgical, social, obstetrical and family history.  Reviewed problem list, medications and allergies.  Physical Assessment:   Vitals:   01/31/20 1434  BP: 119/83  Pulse: 90  Weight: 171 lb 6.4 oz (77.7 kg)  Body mass index is 33.47 kg/m.           Physical Examination:   General appearance: alert, well appearing, and in no distress, oriented to person, place, and time and overweight  Mental status: alert, oriented to person, place, and time, normal mood, behavior, speech, dress, motor activity, and thought processes, affect appropriate to mood  Skin:  warm & dry   Extremities: Edema: None    Cardiovascular: normal heart rate noted  Respiratory: normal respiratory effort, no distress  Abdomen: gravid, soft, non-tender   Baby: 152 bpm  Pelvic: Cervical exam deferred         Fetal Status:     Movement: Present    Fetal Surveillance Testing today: 152 bpm   Chaperone: 04/01/20    Results for orders placed or performed in visit on 01/31/20 (from the past 24 hour(s))  POC Urinalysis Dipstick OB   Collection Time: 01/31/20  2:31 PM  Result Value Ref Range   Color, UA     Clarity, UA     Glucose, UA Negative Negative   Bilirubin, UA     Ketones, UA n    Spec Grav, UA     Blood, UA n    pH, UA     POC,PROTEIN,UA Negative Negative, Trace, Small (1+), Moderate (2+), Large (3+), 4+   Urobilinogen, UA     Nitrite, UA n    Leukocytes, UA Negative Negative   Appearance     Odor       Assessment & Plan:  1) High-risk pregnancy 04/01/20 at [redacted]w[redacted]d with an Estimated Date of Delivery: 05/14/20   2) Birth control options discussed: Reviewed all forms of birth control options available including abstinence; over the counter/barrier methods; depo, nuva ring, Nexplanon, IUD, OCP and patch.  Risks/benefits/side effects of each discussed. Questions were answered. Patient chooses IUD, likely postpartum 4 wk, pt aware of option of post  placental IUD.Marland Kitchen    Meds: No orders of the defined types were placed in this encounter.   Labs/procedures today:   Treatment Plan:  CHTN stable no meds.:  Reviewed:  labor symptoms and general obstetric precautions including but not limited to vaginal bleeding, contractions, leaking of fluid and fetal movement were reviewed in detail with the patient.  All questions were answered. . Check bp weekly, let us know if >140/90.   Follow-up: BPP q wk. nst q wk   By signing my name below, I, [employer], attest that this documentation has been prepared under the direction and in the presence of Myrtis Ser,  CNM. Electronically Signed: [place]. 01/31/20. 2:47 PM.  I personally performed the services described in this documentation, which was SCRIBED in my presence. The recorded information has been reviewed and considered accurate. It has been edited as necessary during review. Jonnie Kind, MD

## 2020-01-31 NOTE — Patient Instructions (Signed)
[facility] at [facility] [address] [place],  [place]  [address] Free 24/7 Valet Parking  Get Driving Directions  Main: [phone]   Megan West, I greatly value your feedback.  If you receive a survey following your visit with [place] today, we appreciate you taking the time to fill it out.  Thanks, Christin Bach, MD   You will have your sugar test next visit.  Please do not eat or drink anything after midnight the night before you come, not even water.  You will be here for at least two hours.  Please make an appointment online for the bloodwork at [email or web address] for 8:30am (or as close to this as possible). Make sure you select the [facility] service center. The day of the appointment, check in with our office first, then you will go to [facility] to start the sugar test.    Go to [email or web address] to register for FREE online childbirth classes   Call the office [phone]) or go to [facility] if:  You begin to have strong, frequent contractions  Your water breaks.  Sometimes it is a big gush of fluid, sometimes it is just a trickle that keeps getting your panties wet or running down your legs  You have vaginal bleeding.  It is normal to have a small amount of spotting if your cervix was checked.   You don't feel your baby moving like normal.  If you don't, get you something to eat and drink and lay down and focus on feeling your baby move.   If your baby is still not moving like normal, you should call the office or go to [facility].  [place] Pediatricians/Family Doctors:  [facility] [phone]            [facility] [phone]                 [facility] [phone] (usually not accepting new patients unless you have family there already, you are always welcome to call and ask)       [facility] Department [phone]       [facility]  Pediatricians/Family Doctors:   [facility]: [phone]  [facility]: [phone]  [facility]: [phone]  [facility] Doctors:   [facility]: [phone]   [facility]: [phone]  [facility] Doctors:  [facility]: [phone]   Home Blood Pressure Monitoring for Patients   Your provider has recommended that you check your blood pressure (BP) at least once a week at home. If you do not have a blood pressure cuff at home, one will be provided for you. Contact your provider if you have not received your monitor within 1 week.   Helpful Tips for Accurate Home Blood Pressure Checks  . Don't smoke, exercise, or drink caffeine 30 minutes before checking your BP . Use the restroom before checking your BP (a full bladder can raise your pressure) . Relax in a comfortable upright chair . Feet on the ground . Left arm resting comfortably on a flat surface at the level of your heart . Legs uncrossed . Back supported . Sit quietly and don't talk . Place the cuff on your bare arm . Adjust snuggly, so that only two fingertips can fit between your skin and the top of the cuff . Check 2 readings separated by at least one minute . Keep a log of your  BP readings . For a visual, please reference this diagram: http://ccnc.care/bpdiagram  Provider Name: [facility]     Phone: [phone]  Zone 1: ALL CLEAR  Continue to monitor your symptoms:  . BP reading is less than 140 (top number) or less than 90 (bottom number)  . No right upper stomach pain . No headaches or seeing spots . No feeling nauseated or throwing up . No swelling in face and hands  Zone 2: CAUTION Call your doctor's office for any of the following:  . BP reading is greater than 140 (top number) or greater than 90 (bottom number)  . Stomach pain under your ribs in the middle or right side . Headaches or  seeing spots . Feeling nauseated or throwing up . Swelling in face and hands  Zone 3: EMERGENCY  Seek immediate medical care if you have any of the following:  . BP reading is greater than160 (top number) or greater than 110 (bottom number) . Severe headaches not improving with Tylenol . Serious difficulty catching your breath . Any worsening symptoms from Zone 2   Second Trimester of Pregnancy The second trimester is from week 13 through week 28, months 4 through 6. The second trimester is often a time when you feel your best. Your body has also adjusted to being pregnant, and you begin to feel better physically. Usually, morning sickness has lessened or quit completely, you may have more energy, and you may have an increase in appetite. The second trimester is also a time when the fetus is growing rapidly. At the end of the sixth month, the fetus is about 9 inches long and weighs about 1 pounds. You will likely begin to feel the baby move (quickening) between 18 and 20 weeks of the pregnancy.  BODY CHANGES Your body goes through many changes during pregnancy. The changes vary from woman to woman.   Your weight will continue to increase. You will notice your lower abdomen bulging out.  You may begin to get stretch marks on your hips, abdomen, and breasts.  You may develop headaches that can be relieved by medicines approved by your health care provider.  You may urinate more often because the fetus is pressing on your bladder.  You may develop or continue to have heartburn as a result of your pregnancy.  You may develop constipation because certain hormones are causing the muscles that push waste through your intestines to slow down.  You may develop hemorrhoids or swollen, bulging veins (varicose veins).  You may have back pain because of the weight gain and pregnancy hormones relaxing your joints between the bones in your pelvis and as a result of a shift in weight and the muscles  that support your balance.  Your breasts will continue to grow and be tender.  Your gums may bleed and may be sensitive to brushing and flossing.  Dark spots or blotches (chloasma, mask of pregnancy) may develop on your face. This will likely fade after the baby is born.  A dark line from your belly button to the pubic area (linea nigra) may appear. This will likely fade after the baby is born.  You may have changes in your hair. These can include thickening of your hair, rapid growth, and changes in texture. Some women also have hair loss during or after pregnancy, or hair that feels dry or thin. Your hair will most likely return to normal after your baby is born.  WHAT TO EXPECT AT [facility]  PRENATAL VISITS During a routine prenatal visit:  You will be weighed to make sure you and the fetus are growing normally.  Your blood pressure will be taken.  Your abdomen will be measured to track your baby's growth.  The fetal heartbeat will be listened to.  Any test results from the previous visit will be discussed. Your health care provider may ask you:  How you are feeling.  If you are feeling the baby move.  If you have had any abnormal symptoms, such as leaking fluid, bleeding, severe headaches, or abdominal cramping.  If you have any questions. Other tests that may be performed during your second trimester include:  Blood tests that check for:  Low iron levels (anemia).  Gestational diabetes (between 24 and 28 weeks).  Rh antibodies.  Urine tests to check for infections, diabetes, or protein in the urine.  An ultrasound to confirm the proper growth and development of the baby.  An amniocentesis to check for possible genetic problems.  Fetal screens for spina bifida and Down syndrome.  HOME CARE INSTRUCTIONS   Avoid all smoking, herbs, alcohol, and unprescribed drugs. These chemicals affect the formation and growth of the baby.  Follow your health care provider's  instructions regarding medicine use. There are medicines that are either safe or unsafe to take during pregnancy.  Exercise only as directed by your health care provider. Experiencing uterine cramps is a good sign to stop exercising.  Continue to eat regular, healthy meals.  Wear a good support bra for breast tenderness.  Do not use hot tubs, steam rooms, or saunas.  Wear your seat belt at all times when driving.  Avoid raw meat, uncooked cheese, cat litter boxes, and soil used by cats. These carry germs that can cause birth defects in the baby.  Take your prenatal vitamins.  Try taking a stool softener (if your health care provider approves) if you develop constipation. Eat more high-fiber foods, such as fresh vegetables or fruit and whole grains. Drink plenty of fluids to keep your urine clear or pale yellow.  Take warm sitz baths to soothe any pain or discomfort caused by hemorrhoids. Use hemorrhoid cream if your health care provider approves.  If you develop varicose veins, wear support hose. Elevate your feet for 15 minutes, 3-4 times a day. Limit salt in your diet.  Avoid heavy lifting, wear low heel shoes, and practice good posture.  Rest with your legs elevated if you have leg cramps or low back pain.  Visit your dentist if you have not gone yet during your pregnancy. Use a soft toothbrush to brush your teeth and be gentle when you floss.  A sexual relationship may be continued unless your health care provider directs you otherwise.  Continue to go to all your prenatal visits as directed by your health care provider.  SEEK MEDICAL CARE IF:   You have dizziness.  You have mild pelvic cramps, pelvic pressure, or nagging pain in the abdominal area.  You have persistent nausea, vomiting, or diarrhea.  You have a bad smelling vaginal discharge.  You have pain with urination.  SEEK IMMEDIATE MEDICAL CARE IF:   You have a fever.  You are leaking fluid from your  vagina.  You have spotting or bleeding from your vagina.  You have severe abdominal cramping or pain.  You have rapid weight gain or loss.  You have shortness of breath with chest pain.  You notice sudden or extreme swelling of your face, hands,  ankles, feet, or legs.  You have not felt your baby move in over an hour.  You have severe headaches that do not go away with medicine.  You have vision changes.  Document Released: 09/03/2001 Document Revised: 09/14/2013 Document Reviewed: 11/10/2012 [facility] Patient Information 2015 [place], [place]. This information is not intended to replace advice given to you by your health care provider. Make sure you discuss any questions you have with your health care provider.

## 2020-02-22 ENCOUNTER — Ambulatory Visit (INDEPENDENT_AMBULATORY_CARE_PROVIDER_SITE_OTHER): Payer: Medicaid Other | Admitting: Obstetrics & Gynecology

## 2020-02-22 ENCOUNTER — Other Ambulatory Visit: Payer: Self-pay

## 2020-02-22 ENCOUNTER — Encounter: Payer: Self-pay | Admitting: Obstetrics & Gynecology

## 2020-02-22 ENCOUNTER — Other Ambulatory Visit: Payer: Medicaid Other

## 2020-02-22 VITALS — BP 117/85 | HR 93 | Wt 175.5 lb

## 2020-02-22 DIAGNOSIS — O0990 Supervision of high risk pregnancy, unspecified, unspecified trimester: Secondary | ICD-10-CM

## 2020-02-22 DIAGNOSIS — Z3A28 28 weeks gestation of pregnancy: Secondary | ICD-10-CM

## 2020-02-22 DIAGNOSIS — Z1389 Encounter for screening for other disorder: Secondary | ICD-10-CM

## 2020-02-22 DIAGNOSIS — O0993 Supervision of high risk pregnancy, unspecified, third trimester: Secondary | ICD-10-CM

## 2020-02-22 DIAGNOSIS — O10013 Pre-existing essential hypertension complicating pregnancy, third trimester: Secondary | ICD-10-CM

## 2020-02-22 DIAGNOSIS — Z331 Pregnant state, incidental: Secondary | ICD-10-CM

## 2020-02-22 DIAGNOSIS — Z131 Encounter for screening for diabetes mellitus: Secondary | ICD-10-CM

## 2020-02-22 DIAGNOSIS — I10 Essential (primary) hypertension: Secondary | ICD-10-CM

## 2020-02-22 LAB — POCT URINALYSIS DIPSTICK OB
Glucose, UA: NEGATIVE
Ketones, UA: NEGATIVE
Nitrite, UA: NEGATIVE
POC,PROTEIN,UA: NEGATIVE

## 2020-02-22 MED ORDER — LABETALOL HCL 100 MG PO TABS: 100.0000 mg | ORAL_TABLET | Freq: Two times a day (BID) | ORAL | 3 refills | Status: AC

## 2020-02-22 NOTE — Progress Notes (Signed)
HIGH-RISK PREGNANCY VISIT Patient name: Megan West MRN [id number]  Date of birth: 08/04/1986 Chief Complaint:   High Risk Gestation (PN2 today)  History of Present Illness:   Megan West is a 34 y.o. [phone] female at [redacted]w[redacted]d with an Estimated Date of Delivery: 05/14/20 being seen today for ongoing management of a high-risk pregnancy complicated by [facility] on labetalol 100 BID.  Today she reports no complaints.  Depression screen [facility] 2/9 02/22/2020 01/05/2020  Decreased Interest 0 1  Down, Depressed, Hopeless 0 0  PHQ - 2 Score 0 1  Altered sleeping 0 0  Tired, decreased energy 1 0  Change in appetite 0 0  Feeling bad or failure about yourself  0 0  Trouble concentrating 0 0  Moving slowly or fidgety/restless 0 0  Suicidal thoughts 0 0  PHQ-9 Score 1 1  Difficult doing work/chores Not difficult at all Not difficult at all    Contractions: Not present. Vag. Bleeding: None.  Movement: Present. denies leaking of fluid.  Review of Systems:   Pertinent items are noted in HPI Denies abnormal vaginal discharge w/ itching/odor/irritation, headaches, visual changes, shortness of breath, chest pain, abdominal pain, severe nausea/vomiting, or problems with urination or bowel movements unless otherwise stated above. Pertinent History Reviewed:  Reviewed past medical,surgical, social, obstetrical and family history.  Reviewed problem list, medications and allergies. Physical Assessment:   Vitals:   02/22/20 0922  BP: 117/85  Pulse: 93  Weight: 175 lb 8 oz (79.6 kg)  Body mass index is 34.27 kg/m.           Physical Examination:   General appearance: alert, well appearing, and in no distress  Mental status: alert, oriented to person, place, and time  Skin: warm & dry   Extremities: Edema: None    Cardiovascular: normal heart rate noted  Respiratory: normal respiratory effort, no distress  Abdomen: gravid, soft, non-tender  Pelvic: Cervical exam deferred         Fetal Status:      Movement: Present    Fetal Surveillance Testing today: FHR 140   Chaperone: n/a    Results for orders placed or performed in visit on 02/22/20 (from the past 24 hour(s))  POC Urinalysis Dipstick OB   Collection Time: 02/22/20  9:23 AM  Result Value Ref Range   Color, UA     Clarity, UA     Glucose, UA Negative Negative   Bilirubin, UA     Ketones, UA neg    Spec Grav, UA     Blood, UA 1+    pH, UA     POC,PROTEIN,UA Negative Negative, Trace, Small (1+), Moderate (2+), Large (3+), 4+   Urobilinogen, UA     Nitrite, UA neg    Leukocytes, UA Large (3+) (A) Negative   Appearance     Odor      Assessment & Plan:  1) High-risk pregnancy [id number] at [redacted]w[redacted]d with an Estimated Date of Delivery: 05/14/20   2) CHTN, stable, labetalol 100 mg BID   Meds:  Meds ordered this encounter  Medications   labetalol (NORMODYNE) 100 MG tablet    Sig: Take 1 tablet (100 mg total) by mouth 2 (two) times daily.    Dispense:  60 tablet    Refill:  3    Labs/procedures today: PN2 today, start twice weekly testing at 32 weeks  Treatment Plan:  Was started by [facility] say 2+ months ago on labetalol 100 BID  Reviewed: Preterm labor  symptoms and general obstetric precautions including but not limited to vaginal bleeding, contractions, leaking of fluid and fetal movement were reviewed in detail with the patient.  All questions were answered. Has home bp cuff. Rx faxed to . Check bp weekly, let us know if >140/90.   Follow-up: Return in about 4 weeks (around 03/21/2020) for BPP/sono, HROB.  Orders Placed This Encounter  Procedures   US Fetal BPP W/O Non Stress   [place] UA Cord Doppler   POC Urinalysis Dipstick OB   Megan West  02/22/2020 9:47 AM

## 2020-02-23 LAB — CBC
Hematocrit: 32.5 % — ABNORMAL LOW (ref 34.0–46.6)
Hemoglobin: 11 g/dL — ABNORMAL LOW (ref 11.1–15.9)
MCH: 31.3 pg (ref 26.6–33.0)
MCHC: 33.8 g/dL (ref 31.5–35.7)
MCV: 93 fL (ref 79–97)
Platelets: 242 10*3/uL (ref 150–450)
RBC: 3.51 x10E6/uL — ABNORMAL LOW (ref 3.77–5.28)
RDW: 13.1 % (ref 11.7–15.4)
WBC: 11.5 10*3/uL — ABNORMAL HIGH (ref 3.4–10.8)

## 2020-02-23 LAB — HIV ANTIBODY (ROUTINE TESTING W REFLEX): HIV Screen 4th Generation wRfx: NONREACTIVE

## 2020-02-23 LAB — GLUCOSE TOLERANCE, 2 HOURS W/ 1HR
Glucose, 1 hour: 119 mg/dL (ref 65–179)
Glucose, 2 hour: 110 mg/dL (ref 65–152)
Glucose, Fasting: 69 mg/dL (ref 65–91)

## 2020-02-23 LAB — RPR: RPR Ser Ql: NONREACTIVE

## 2020-02-23 LAB — ANTIBODY SCREEN: Antibody Screen: NEGATIVE

## 2020-03-20 ENCOUNTER — Other Ambulatory Visit: Payer: Self-pay | Admitting: Obstetrics & Gynecology

## 2020-03-20 DIAGNOSIS — I10 Essential (primary) hypertension: Secondary | ICD-10-CM

## 2020-03-21 ENCOUNTER — Other Ambulatory Visit: Payer: Medicaid Other

## 2020-03-21 ENCOUNTER — Encounter: Payer: Medicaid Other | Admitting: Obstetrics & Gynecology

## 2023-12-26 ENCOUNTER — Other Ambulatory Visit: Payer: Self-pay

## 2023-12-26 ENCOUNTER — Emergency Department (HOSPITAL_COMMUNITY)

## 2023-12-26 ENCOUNTER — Encounter (HOSPITAL_COMMUNITY): Payer: Self-pay | Admitting: *Deleted

## 2023-12-26 ENCOUNTER — Emergency Department (HOSPITAL_COMMUNITY)
Admission: EM | Admit: 2023-12-26 | Discharge: 2023-12-26 | Disposition: A | Discharge status: Home / Self Care | Attending: Emergency Medicine | Admitting: Emergency Medicine

## 2023-12-26 DIAGNOSIS — R112 Nausea with vomiting, unspecified: Secondary | ICD-10-CM | POA: Diagnosis present

## 2023-12-26 DIAGNOSIS — R197 Diarrhea, unspecified: Secondary | ICD-10-CM | POA: Diagnosis not present

## 2023-12-26 DIAGNOSIS — R1084 Generalized abdominal pain: Secondary | ICD-10-CM | POA: Insufficient documentation

## 2023-12-26 DIAGNOSIS — Z7982 Long term (current) use of aspirin: Secondary | ICD-10-CM | POA: Insufficient documentation

## 2023-12-26 LAB — URINALYSIS, ROUTINE W REFLEX MICROSCOPIC
Bilirubin Urine: NEGATIVE
Glucose, UA: NEGATIVE mg/dL
Ketones, ur: NEGATIVE mg/dL
Leukocytes,Ua: NEGATIVE
Nitrite: NEGATIVE
Protein, ur: 30 mg/dL — AB
Specific Gravity, Urine: 1.01 (ref 1.005–1.030)
pH: 5 (ref 5.0–8.0)

## 2023-12-26 LAB — HCG, QUANTITATIVE, PREGNANCY: hCG, Beta Chain, Quant, S: <1 m[IU]/mL (ref <5)

## 2023-12-26 LAB — CBC
HCT: 43.4 % (ref 36.0–46.0)
Hemoglobin: 14.8 g/dL (ref 12.0–15.0)
MCH: 29.8 pg (ref 26.0–34.0)
MCHC: 34.1 g/dL (ref 30.0–36.0)
MCV: 87.5 fL (ref 80.0–100.0)
Platelets: 447 10*3/uL — ABNORMAL HIGH (ref 150–400)
RBC: 4.96 MIL/uL (ref 3.87–5.11)
RDW: 13.3 % (ref 11.5–15.5)
WBC: 15.2 10*3/uL — ABNORMAL HIGH (ref 4.0–10.5)
nRBC: 0 % (ref 0.0–0.2)

## 2023-12-26 LAB — COMPREHENSIVE METABOLIC PANEL WITH GFR
ALT: 34 U/L (ref 0–44)
AST: 25 U/L (ref 15–41)
Albumin: 4.4 g/dL (ref 3.5–5.0)
Alkaline Phosphatase: 75 U/L (ref 38–126)
Anion gap: 11 (ref 5–15)
BUN: 13 mg/dL (ref 6–20)
CO2: 25 mmol/L (ref 22–32)
Calcium: 10 mg/dL (ref 8.9–10.3)
Chloride: 102 mmol/L (ref 98–111)
Creatinine, Ser: 0.76 mg/dL (ref 0.44–1.00)
GFR, Estimated: >60 mL/min (ref >60)
Glucose, Bld: 102 mg/dL — ABNORMAL HIGH (ref 70–99)
Potassium: 4 mmol/L (ref 3.5–5.1)
Sodium: 138 mmol/L (ref 135–145)
Total Bilirubin: 0.8 mg/dL (ref 0.0–1.2)
Total Protein: 8.6 g/dL — ABNORMAL HIGH (ref 6.5–8.1)

## 2023-12-26 LAB — LIPASE, BLOOD: Lipase: 40 U/L (ref 11–51)

## 2023-12-26 LAB — PREGNANCY, URINE: Preg Test, Ur: NEGATIVE

## 2023-12-26 MED ORDER — ONDANSETRON 4 MG PO TBDP: ORAL_TABLET | ORAL | 0 refills | Status: AC

## 2023-12-26 MED ORDER — ONDANSETRON HCL 4 MG/2ML IJ SOLN
4.0000 mg | Freq: Once | INTRAMUSCULAR | Status: AC | PRN
  Administered 2023-12-26: 4 mg via INTRAVENOUS
  Filled 2023-12-26: qty 2

## 2023-12-26 MED ORDER — SODIUM CHLORIDE 0.9% FLUSH
3.0000 mL | Freq: Once | INTRAVENOUS | Status: AC
  Administered 2023-12-26: 3 mL via INTRAVENOUS

## 2023-12-26 MED ORDER — IOHEXOL 300 MG/ML  SOLN
100.0000 mL | Freq: Once | INTRAMUSCULAR | Status: AC | PRN
  Administered 2023-12-26: 100 mL via INTRAVENOUS

## 2023-12-26 MED ORDER — CIPROFLOXACIN HCL 500 MG PO TABS
500.0000 mg | ORAL_TABLET | Freq: Two times a day (BID) | ORAL | 0 refills | Status: DC
Start: 2023-12-26 — End: 2024-05-07

## 2023-12-26 MED ORDER — DICYCLOMINE HCL 20 MG PO TABS: ORAL_TABLET | ORAL | 0 refills | Status: AC

## 2023-12-26 MED ORDER — ONDANSETRON HCL 4 MG/2ML IJ SOLN
4.0000 mg | Freq: Once | INTRAMUSCULAR | Status: DC
  Filled 2023-12-26: qty 2

## 2023-12-26 NOTE — ED Provider Notes (Signed)
 [place] EMERGENCY DEPARTMENT AT [facility] Provider Note   CSN: [id number] Arrival date & time: 12/26/23  1013     History  Chief Complaint  Patient presents with   Abdominal Pain    Megan West is a 38 y.o. female.  Patient with a history of diverticulitis.  She presents with vomiting and diarrhea  The history is provided by the patient and medical records. No language interpreter was used.  Abdominal Pain Pain location:  Generalized Pain quality: aching   Pain radiates to:  Does not radiate Pain severity:  Moderate Onset quality:  Sudden Timing:  Constant Progression:  Worsening Chronicity:  New Context: not alcohol use   Relieved by:  Nothing Worsened by:  Nothing Associated symptoms: no chest pain, no cough, no diarrhea, no fatigue and no hematuria        Home Medications Prior to Admission medications   Medication Sig Start Date End Date Taking? Authorizing Provider  ciprofloxacin (CIPRO) 500 MG tablet Take 1 tablet (500 mg total) by mouth 2 (two) times daily. One po bid x 7 days 12/26/23  Yes Bethann Berkshire, MD  dicyclomine (BENTYL) 20 MG tablet Take 1 every 8 hours as needed for abdominal cramping 12/26/23  Yes Bethann Berkshire, MD  ondansetron (ZOFRAN-ODT) 4 MG disintegrating tablet 4mg  ODT q4 hours prn nausea/vomit 12/26/23  Yes Bethann Berkshire, MD  aspirin EC 81 MG tablet Take 81 mg by mouth daily.    [provider]  Blood Pressure Monitor MISC For regular home bp monitoring during pregnancy 01/05/20   Cheral Marker, CNM  labetalol (NORMODYNE) 100 MG tablet Take 1 tablet (100 mg total) by mouth 2 (two) times daily. 02/22/20   Lazaro Arms, MD  Prenatal Vit-Fe Fumarate-FA (PRENATAL VITAMIN) 27-0.8 MG TABS Take by mouth.    [provider]      Allergies    Patient has no known allergies.    Review of Systems   Review of Systems  Constitutional:  Negative for appetite change and fatigue.  HENT:  Negative for congestion,  ear discharge and sinus pressure.   Eyes:  Negative for discharge.  Respiratory:  Negative for cough.   Cardiovascular:  Negative for chest pain.  Gastrointestinal:  Positive for abdominal pain. Negative for diarrhea.  Genitourinary:  Negative for frequency and hematuria.  Musculoskeletal:  Negative for back pain.  Skin:  Negative for rash.  Neurological:  Negative for seizures and headaches.  Psychiatric/Behavioral:  Negative for hallucinations.     Physical Exam Updated Vital Signs BP (!) 132/103 (BP Location: Right Arm)   Pulse 66   Temp 98 F (36.7 C) (Oral)   Resp (!) 24   Ht 5\' 1"  (1.549 m)   Wt 81.6 kg   SpO2 94%   BMI 34.01 kg/m  Physical Exam Vitals and nursing note reviewed.  Constitutional:      Appearance: She is well-developed.  HENT:     Head: Normocephalic.     Mouth/Throat:     Mouth: Mucous membranes are moist.  Eyes:     General: No scleral icterus.    Conjunctiva/sclera: Conjunctivae normal.  Neck:     Thyroid: No thyromegaly.  Cardiovascular:     Rate and Rhythm: Normal rate and regular rhythm.     Heart sounds: No murmur heard.    No friction rub. No gallop.  Pulmonary:     Breath sounds: No stridor. No wheezing or rales.  Chest:  Chest wall: No tenderness.  Abdominal:     General: There is no distension.     Tenderness: There is abdominal tenderness. There is no rebound.  Musculoskeletal:        General: Normal range of motion.     Cervical back: Neck supple.  Lymphadenopathy:     Cervical: No cervical adenopathy.  Skin:    Findings: No erythema or rash.  Neurological:     Mental Status: She is alert and oriented to person, place, and time.     Motor: No abnormal muscle tone.     Coordination: Coordination normal.  Psychiatric:        Behavior: Behavior normal.     ED Results / Procedures / Treatments   Labs (all labs ordered are listed, but only abnormal results are displayed) Labs Reviewed  COMPREHENSIVE METABOLIC PANEL  WITH GFR - Abnormal; Notable for the following components:      Result Value   Glucose, Bld 102 (*)    Total Protein 8.6 (*)    All other components within normal limits  CBC - Abnormal; Notable for the following components:   WBC 15.2 (*)    Platelets 447 (*)    All other components within normal limits  URINALYSIS, ROUTINE W REFLEX MICROSCOPIC - Abnormal; Notable for the following components:   Hgb urine dipstick MODERATE (*)    Protein, ur 30 (*)    Bacteria, UA RARE (*)    All other components within normal limits  LIPASE, BLOOD  PREGNANCY, URINE  HCG, QUANTITATIVE, PREGNANCY    EKG None  Radiology CT ABDOMEN PELVIS W CONTRAST Result Date: 12/26/2023 CLINICAL DATA:  Acute left abdominal pain beginning this morning. Nausea and vomiting. Diarrhea. Prior colon resection for diverticulitis. EXAM: CT ABDOMEN AND PELVIS WITH CONTRAST TECHNIQUE: Multidetector CT imaging of the abdomen and pelvis was performed using the standard protocol following bolus administration of intravenous contrast. RADIATION DOSE REDUCTION: This exam was performed according to the departmental dose-optimization program which includes automated exposure control, adjustment of the mA and/or kV according to patient size and/or use of iterative reconstruction technique. CONTRAST:  OMNIPAQUE IOHEXOL 300 MG/ML  SOLN COMPARISON:  04/15/2019 FINDINGS: Lower Chest: No acute findings. Hepatobiliary:  No suspicious hepatic masses identified. Pancreas:  No mass or inflammatory changes. Spleen: Within normal limits in size and appearance. Adrenals/Urinary Tract: No suspicious masses identified. No evidence of ureteral calculi or hydronephrosis. Stomach/Bowel: Surgical staples seen in the sigmoid colon. No evidence of bowel wall thickening. No evidence of obstruction, inflammatory process or abnormal fluid collections. Fluid seen throughout the colon, consistent with diarrheal illness. Vascular/Lymphatic: No pathologically  enlarged lymph nodes. No acute vascular findings. Reproductive: IUD seen in appropriate position. No mass or other significant abnormality. Other:  None. Musculoskeletal:  No suspicious bone lesions identified. IMPRESSION: Fluid throughout the colon, consistent with diarrheal illness. No evidence of focal inflammatory process or other acute findings. Electronically Signed   By: Danae Orleans M.D.   On: 12/26/2023 14:28    Procedures Procedures    Medications Ordered in ED Medications  ondansetron (ZOFRAN) injection 4 mg (has no administration in time range)  sodium chloride flush (NS) 0.9 % injection 3 mL (3 mLs Intravenous Given 12/26/23 1145)  ondansetron (ZOFRAN) injection 4 mg (4 mg Intravenous Given 12/26/23 1145)  iohexol (OMNIPAQUE) 300 MG/ML solution 100 mL (100 mLs Intravenous Contrast Given 12/26/23 1358)    ED Course/ Medical Decision Making/ A&P  Medical Decision Making Amount and/or Complexity of Data Reviewed Labs: ordered. Radiology: ordered.  Risk Prescription drug management.   Patient with abdominal pain vomiting and diarrhea suspect gastroenteritis.  Patient placed on Zofran and Bentyl but she will also be empirically treated with Cipro and follow-up with her PCP        Final Clinical Impression(s) / ED Diagnoses Final diagnoses:  Nausea vomiting and diarrhea    Rx / DC Orders ED Discharge Orders          Ordered    ondansetron (ZOFRAN-ODT) 4 MG disintegrating tablet        12/26/23 1440    dicyclomine (BENTYL) 20 MG tablet        12/26/23 1440    ciprofloxacin (CIPRO) 500 MG tablet  2 times daily        12/26/23 1440              Bethann Berkshire, MD 12/28/23 1002

## 2023-12-26 NOTE — Discharge Instructions (Signed)
Drink plenty of fluids and follow-up with your family doctor next week for recheck °

## 2023-12-26 NOTE — ED Triage Notes (Signed)
 Pt c/o LUQ abdominal pain along with n/v/d that started this morning.

## 2024-05-07 ENCOUNTER — Emergency Department (HOSPITAL_COMMUNITY)
Admission: EM | Admit: 2024-05-07 | Discharge: 2024-05-07 | Disposition: A | Discharge status: Home / Self Care | Source: Ambulatory Visit | Attending: Emergency Medicine | Admitting: Emergency Medicine

## 2024-05-07 ENCOUNTER — Emergency Department (HOSPITAL_COMMUNITY)

## 2024-05-07 ENCOUNTER — Other Ambulatory Visit: Payer: Self-pay

## 2024-05-07 ENCOUNTER — Encounter (HOSPITAL_COMMUNITY): Payer: Self-pay | Admitting: Emergency Medicine

## 2024-05-07 DIAGNOSIS — R109 Unspecified abdominal pain: Secondary | ICD-10-CM | POA: Insufficient documentation

## 2024-05-07 DIAGNOSIS — R110 Nausea: Secondary | ICD-10-CM | POA: Insufficient documentation

## 2024-05-07 DIAGNOSIS — Z7982 Long term (current) use of aspirin: Secondary | ICD-10-CM | POA: Insufficient documentation

## 2024-05-07 NOTE — Discharge Instructions (Signed)
 Please follow-up closely with your primary care doctor and gynecologist on an outpatient basis.  Please ensure that you follow-up with your gynecologist regarding the enlargement of your ovaries and blood vessels to the ovaries.  Return to emergency department immediately for any new or worsening symptoms.

## 2024-05-07 NOTE — ED Triage Notes (Signed)
 Patient stated that she has had left mid stomach pain and left flank pain since last night. Patient stated that she was nausea at this time. She also reports some diarrhea

## 2024-05-07 NOTE — ED Provider Notes (Signed)
 [facility] EMERGENCY DEPARTMENT AT [facility] Provider Note   CSN: [id number] Arrival date & time: 05/07/24  1255     Patient presents with: Flank Pain   Megan West is a 38 y.o. female.   Patient is a 38 year old female who presents to the emergency department the chief complaint of left-sided flank pain which has been ongoing since yesterday.  She was evaluated at [facility] and was told that they believe that she may have a kidney stone.  They were unable to form a CT scan as their scanner was down and did request to transfer to our facility.  She did decide to come to our facility POV.  She notes that the pain has improved at this point.  She did have associated nausea without vomiting but this has improved after medication at the previous facility.  She denies any associated fever or chills.  She denies any dysuria or hematuria.  She denies any recent falls or blunt abdominal wall trauma.   Flank Pain       Prior to Admission medications   Medication Sig Start Date End Date Taking? Authorizing Provider  aspirin EC 81 MG tablet Take 81 mg by mouth daily.    [provider]  Blood Pressure Monitor MISC For regular home bp monitoring during pregnancy 01/05/20   Kizzie Suzen SAUNDERS, CNM  ciprofloxacin  (CIPRO ) 500 MG tablet Take 1 tablet (500 mg total) by mouth 2 (two) times daily. One po bid x 7 days 12/26/23   Zammit, Joseph, MD  dicyclomine  (BENTYL ) 20 MG tablet Take 1 every 8 hours as needed for abdominal cramping 12/26/23   Suzette Pac, MD  labetalol  (NORMODYNE ) 100 MG tablet Take 1 tablet (100 mg total) by mouth 2 (two) times daily. 02/22/20   Jayne Vonn DEL, MD  ondansetron  (ZOFRAN -ODT) 4 MG disintegrating tablet 4mg  ODT q4 hours prn nausea/vomit 12/26/23   Zammit, Joseph, MD  Prenatal Vit-Fe Fumarate-FA (PRENATAL VITAMIN) 27-0.8 MG TABS Take by mouth.    [provider]    Allergies: Patient has no known allergies.    Review of Systems   Genitourinary:  Positive for flank pain.  All other systems reviewed and are negative.   Updated Vital Signs BP 120/87   Pulse 69   Temp 97.7 F (36.5 C) (Oral)   Resp 17   Ht 5' (1.524 m)   Wt 88.5 kg   SpO2 99%   BMI 38.08 kg/m   Physical Exam Vitals and nursing note reviewed.  Constitutional:      Appearance: Normal appearance.  HENT:     Head: Normocephalic and atraumatic.     Nose: Nose normal.     Mouth/Throat:     Mouth: Mucous membranes are moist.  Eyes:     Extraocular Movements: Extraocular movements intact.     Conjunctiva/sclera: Conjunctivae normal.     Pupils: Pupils are equal, round, and reactive to light.  Cardiovascular:     Rate and Rhythm: Normal rate and regular rhythm.     Pulses: Normal pulses.     Heart sounds: Normal heart sounds. No murmur heard.    No gallop.  Pulmonary:     Effort: Pulmonary effort is normal. No respiratory distress.     Breath sounds: Normal breath sounds. No stridor. No wheezing, rhonchi or rales.  Abdominal:     General: Abdomen is flat. Bowel sounds are normal. There is no distension.     Palpations: Abdomen is soft.  Tenderness: There is no abdominal tenderness. There is no guarding.  Musculoskeletal:        General: Normal range of motion.     Cervical back: Normal range of motion and neck supple.  Skin:    General: Skin is warm and dry.  Neurological:     General: No focal deficit present.     Mental Status: She is alert and oriented to person, place, and time. Mental status is at baseline.  Psychiatric:        Mood and Affect: Mood normal.        Behavior: Behavior normal.        Thought Content: Thought content normal.        Judgment: Judgment normal.     (all labs ordered are listed, but only abnormal results are displayed) Labs Reviewed - No data to display  EKG: None  Radiology: No results found.   Procedures   Medications Ordered in the ED - No data to display                                   Medical Decision Making Amount and/or Complexity of Data Reviewed Radiology: ordered.   This patient presents to the ED for concern of flank pain differential diagnosis includes acute appendicitis, cholecystitis, bowel obstruction, diverticulitis, ovarian torsion cyst, PID, tumor and abscess, pyelonephritis, kidney stone, pancreatitis, mesenteric ischemia    Additional history obtained:  Additional history obtained from medical records External records from outside source obtained and reviewed including medical records   Lab Tests:  I Ordered, and personally interpreted labs.  The pertinent results include: Reviewed from outlying facility   Imaging Studies ordered:  I ordered imaging studies including CT scan of abdomen pelvis I independently visualized and interpreted imaging which showed prominence of bilateral ovaries, enlargement of ovarian veins I agree with the radiologist interpretation    Problem List / ED Course:  Patient is doing well at this time and is stable for discharge home.  Discussed with patient that CT scan of the abdomen and pelvis has been overall unremarkable.  She was made aware of the enlargement of bilateral ovaries and prominence of the veins.  She was provided with a copy of her CT scan report and will follow-up with her gynecologist for this.  Low suspicion for ovarian torsion at this point.  Do not suspect an emergent ultrasound is warranted.  Blood work and urinalysis from sending facility was otherwise unremarkable.  Do not suspect pyelonephritis at this time.  Strict turn precautions were provided for any new or worsening symptoms.  Patient voiced understanding and had no additional questions.   Social Determinants of Health:  None        Final diagnoses:  None    ED Discharge Orders     None          Daralene Lonni JONETTA West 05/07/24 1538    Megan Ozell BROCKS, MD 05/07/24 1734

## 2024-05-07 NOTE — ED Triage Notes (Signed)
 Pt to the ED with complaints of left side flank pain.  Pt was seen at [facility] and was told she may have kidney stone, but their CT is down.
